# Patient Record
Sex: Female | Born: 1995 | Race: Black or African American | Hispanic: No | Marital: Single | Smoking: Never smoker
Health system: Southern US, Community
[De-identification: ages and names within clinical notes are randomized; demographics above are authoritative.]

## PROBLEM LIST (undated history)

## (undated) DIAGNOSIS — F419 Anxiety disorder, unspecified: Secondary | ICD-10-CM

## (undated) DIAGNOSIS — T7840XA Allergy, unspecified, initial encounter: Secondary | ICD-10-CM

## (undated) DIAGNOSIS — F32A Depression, unspecified: Secondary | ICD-10-CM

## (undated) DIAGNOSIS — J45909 Unspecified asthma, uncomplicated: Secondary | ICD-10-CM

## (undated) DIAGNOSIS — D649 Anemia, unspecified: Secondary | ICD-10-CM

## (undated) DIAGNOSIS — E119 Type 2 diabetes mellitus without complications: Secondary | ICD-10-CM

## (undated) HISTORY — DX: Unspecified asthma, uncomplicated: J45.909

## (undated) HISTORY — DX: Allergy, unspecified, initial encounter: T78.40XA

## (undated) HISTORY — DX: Depression, unspecified: F32.A

## (undated) HISTORY — DX: Anemia, unspecified: D64.9

## (undated) HISTORY — DX: Anxiety disorder, unspecified: F41.9

## (undated) HISTORY — DX: Type 2 diabetes mellitus without complications: E11.9

## (undated) HISTORY — PX: TONSILLECTOMY: SUR1361

---
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
## 2019-06-17 LAB — BASIC METABOLIC PANEL
BUN: 14 (ref 4–21)
Creatinine: 1.1 (ref 0.5–1.1)
Glucose: 382
Potassium: 4.3 (ref 3.4–5.3)
Sodium: 134 — AB (ref 137–147)

## 2019-06-17 LAB — HEPATIC FUNCTION PANEL
ALT: 13 (ref 7–35)
AST: 15 (ref 13–35)
Alkaline Phosphatase: 134 — AB (ref 25–125)
Bilirubin, Total: 0.7

## 2019-06-17 LAB — CBC AND DIFFERENTIAL
HCT: 41 (ref 36–46)
Hemoglobin: 13.3 (ref 12.0–16.0)
Neutrophils Absolute: 5
Platelets: 252 (ref 150–399)
WBC: 8.5

## 2019-06-17 LAB — NOVEL CORONAVIRUS, NAA: SARS-CoV-2, NAA: NOT DETECTED

## 2019-06-17 LAB — TSH: TSH: 0.69 (ref 0.41–5.90)

## 2019-06-18 LAB — CBC AND DIFFERENTIAL
HCT: 39 (ref 36–46)
Hemoglobin: 13 (ref 12.0–16.0)
Neutrophils Absolute: 7
Platelets: 220 (ref 150–399)
WBC: 11.4

## 2019-06-18 LAB — PROTIME-INR: Protime: 12 (ref 10.0–13.8)

## 2019-06-18 LAB — BASIC METABOLIC PANEL: BUN: 15 (ref 4–21)

## 2019-06-18 LAB — POCT INR: INR: 1.1 (ref 0.9–1.1)

## 2019-06-19 LAB — BASIC METABOLIC PANEL
Creatinine: 1 (ref 0.5–1.1)
Glucose: 483
Potassium: 4.3 (ref 3.4–5.3)
Sodium: 134 — AB (ref 137–147)

## 2019-07-03 ENCOUNTER — Other Ambulatory Visit: Payer: Self-pay

## 2019-07-04 ENCOUNTER — Other Ambulatory Visit: Payer: Commercial Managed Care - PPO

## 2019-07-04 ENCOUNTER — Encounter: Payer: Self-pay | Admitting: Nurse Practitioner

## 2019-07-04 ENCOUNTER — Ambulatory Visit: Payer: Commercial Managed Care - PPO | Admitting: Nurse Practitioner

## 2019-07-04 VITALS — BP 104/74 | HR 78 | Temp 97.5°F | Ht 68.0 in | Wt 219.2 lb

## 2019-07-04 DIAGNOSIS — E119 Type 2 diabetes mellitus without complications: Secondary | ICD-10-CM | POA: Insufficient documentation

## 2019-07-04 DIAGNOSIS — J4531 Mild persistent asthma with (acute) exacerbation: Secondary | ICD-10-CM | POA: Diagnosis not present

## 2019-07-04 DIAGNOSIS — E1165 Type 2 diabetes mellitus with hyperglycemia: Secondary | ICD-10-CM | POA: Diagnosis not present

## 2019-07-04 DIAGNOSIS — D573 Sickle-cell trait: Secondary | ICD-10-CM | POA: Insufficient documentation

## 2019-07-04 DIAGNOSIS — E1065 Type 1 diabetes mellitus with hyperglycemia: Secondary | ICD-10-CM | POA: Insufficient documentation

## 2019-07-04 LAB — MICROALBUMIN / CREATININE URINE RATIO
Creatinine,U: 94.3 mg/dL
Microalb Creat Ratio: 0.7 mg/g (ref 0.0–30.0)
Microalb, Ur: <0.7 mg/dL (ref 0.0–1.9)

## 2019-07-04 MED ORDER — FLUTICASONE-SALMETEROL 100-50 MCG/DOSE IN AEPB: 1.0000 | INHALATION_SPRAY | Freq: Two times a day (BID) | RESPIRATORY_TRACT | 3 refills | Status: DC

## 2019-07-04 MED ORDER — BLOOD GLUCOSE MONITOR KIT: PACK | 0 refills | Status: DC

## 2019-07-04 NOTE — Progress Notes (Signed)
Subjective:  Patient ID: Carrie Hudson, female    DOB: [birth year]  Age: 23 y.o. 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 She was evaluated in Ed in [place] for persistent SOB despite use of albuterol. She was found to have glucose level of 368. Reviewed lab and radiology results on patient's mobile device: CBC, CMP, TSH, pregnancy test, D-dimer, PT/INR, and COVID. Only acute finding was elevated glucose.  Asthma She complains of chest tightness, difficulty breathing and shortness of breath. There is no cough, frequent throat clearing, hemoptysis, hoarse voice, sputum production or wheezing. This is a recurrent problem. The current episode started more than 1 month ago. The problem occurs constantly. The problem has been unchanged. Associated symptoms include dyspnea on exertion and malaise/fatigue. Pertinent negatives include no appetite change, chest pain, ear congestion, ear pain, fever, headaches, heartburn, myalgias, nasal congestion, orthopnea, postnasal drip, rhinorrhea, sneezing, sore throat, sweats, trouble swallowing or weight loss. Her symptoms are aggravated by change in weather and strenuous activity. Her symptoms are alleviated by beta-agonist. She reports minimal improvement on treatment. Her past medical history is significant for asthma.  Diabetes She presents for her initial diabetic visit. No MedicAlert identification noted. Onset time: unknown. Pertinent negatives for hypoglycemia include no headaches or sweats. Associated symptoms include fatigue, polydipsia and polyuria. Pertinent negatives for diabetes include no blurred vision, no chest pain, no foot paresthesias, no foot ulcerations, no polyphagia, no visual change, no weakness and no weight loss. There are no hypoglycemic complications. There are no diabetic  complications. Risk factors for coronary artery disease include obesity. Current diabetic treatment includes oral agent (monotherapy). She is compliant with treatment all of the time. Her weight is stable. She is following a generally healthy diet. When asked about meal planning, she reported none. She has not had a previous visit with a dietitian. She participates in exercise daily. Home blood sugar record trend: does not check. An ACE inhibitor/angiotensin II receptor blocker is not being taken. She does not see a podiatrist.Eye exam is not current.   Past Medical History:  Diagnosis Date  . Allergy   . Asthma   . Diabetes mellitus without complication [facility])    Social History   Socioeconomic History  . Marital status: Single    Spouse name: Not on file  . Number of children: Not on file  . Years of education: Not on file  . Highest education level: Not on file  Occupational History  . Not on file  Social Needs  . Financial resource strain: Not on file  . Food insecurity    Worry: Not on file    Inability: Not on file  . Transportation needs    Medical: Not on file    Non-medical: Not on file  Tobacco Use  . Smoking status: Never Smoker  . Smokeless tobacco: Never Used  Substance and Sexual Activity  . Alcohol use: Yes    Alcohol/week: 1.0 standard drinks    Types: 1 Shots of liquor per week    Comment: once every 2 wks  . Drug use: Never  . Sexual activity: Not on file  Lifestyle  . Physical activity    Days per week: Not on file    Minutes per session: Not on file  . Stress: Not on file  Relationships  .  Social Herbalist on phone: Not on file    Gets together: Not on file    Attends religious service: Not on file    Active member of club or organization: Not on file    Attends meetings of clubs or organizations: Not on file    Relationship status: Not on file  . Intimate partner violence    Fear of current or ex partner: Not on file    Emotionally  abused: Not on file    Physically abused: Not on file    Forced sexual activity: Not on file  Other Topics Concern  . Not on file  Social History Narrative  . Not on file   Family History  Problem Relation Age of Onset  . Diabetes Father   . Diabetes Paternal Grandfather   . Sickle cell anemia Brother    Outpatient Medications Prior to Visit  Medication Sig Dispense Refill  . metFORMIN (GLUCOPHAGE) 1000 MG tablet     . montelukast (SINGULAIR) 10 MG tablet      No facility-administered medications prior to visit.     ROS See HPI  Objective:  BP 104/74   Pulse 78   Temp (!) 97.5 F (36.4 C) (Tympanic)   Ht '5\' 8"'  (1.727 m)   Wt 219 lb 3.2 oz (99.4 kg)   SpO2 97%   BMI 33.33 kg/m   BP Readings from Last 3 Encounters:  07/04/19 104/74    Wt Readings from Last 3 Encounters:  07/04/19 219 lb 3.2 oz (99.4 kg)    Physical Exam Vitals signs reviewed.  Constitutional:      Appearance: Normal appearance. She is obese.  Neck:     Musculoskeletal: Normal range of motion and neck supple.  Cardiovascular:     Rate and Rhythm: Normal rate and regular rhythm.     Pulses: Normal pulses.          Dorsalis pedis pulses are 2+ on the right side and 2+ on the left side.       Posterior tibial pulses are 2+ on the right side and 2+ on the left side.     Heart sounds: Normal heart sounds.  Pulmonary:     Effort: Pulmonary effort is normal.     Breath sounds: Normal breath sounds.  Musculoskeletal:     Right lower leg: No edema.     Left lower leg: No edema.     Right foot: Normal range of motion.     Left foot: Normal range of motion.  Feet:     Right foot:     Protective Sensation: 8 sites tested. 8 sites sensed.     Skin integrity: Skin integrity normal.     Toenail Condition: Right toenails are normal.     Left foot:     Protective Sensation: 8 sites tested. 8 sites sensed.     Skin integrity: Skin integrity normal.     Toenail Condition: Left toenails are normal.   Lymphadenopathy:     Cervical: No cervical adenopathy.  Skin:    General: Skin is warm and dry.  Neurological:     Mental Status: She is alert and oriented to person, place, and time.  Psychiatric:        Mood and Affect: Mood normal.        Behavior: Behavior normal.    Lab Results  Component Value Date   HGBA1C 11.5 (H) 07/04/2019   MICROALBUR <0.7 07/04/2019   Assessment &  Plan:   Maliyah was seen today for establish care.  Diagnoses and all orders for this visit:  Type 2 diabetes mellitus with hyperglycemia, without long-term current use of insulin (HCC) -     Hemoglobin A1c -     Microalbumin / creatinine urine ratio -     blood glucose meter kit and supplies KIT; Dispense based on patient and insurance preference. Check glucose before breakfast. E11.29  Mild persistent asthma with acute exacerbation -     Fluticasone-Salmeterol (ADVAIR) 100-50 MCG/DOSE AEPB; Inhale 1 puff into the lungs 2 (two) times daily. Rinse mouth after each use   I am having Taresa A. Esker start on Fluticasone-Salmeterol and blood glucose meter kit and supplies. I am also having her maintain her montelukast and metFORMIN.  Meds ordered this encounter  Medications  . Fluticasone-Salmeterol (ADVAIR) 100-50 MCG/DOSE AEPB    Sig: Inhale 1 puff into the lungs 2 (two) times daily. Rinse mouth after each use    Dispense:  60 each    Refill:  3    Order Specific Question:   Supervising Provider    Answer:   MATTHEWS, CODY [[id number]]  . blood glucose meter kit and supplies KIT    Sig: Dispense based on patient and insurance preference. Check glucose before breakfast. E11.29    Dispense:  1 each    Refill:  0    Order Specific Question:   Supervising Provider    Answer:   MATTHEWS, CODY [[id number]]    Order Specific Question:   Number of strips    Answer:   100    Order Specific Question:   Number of lancets    Answer:   100    Problem List Items Addressed This Visit      Respiratory   Mild persistent  asthma with acute exacerbation    Albuterol prn Added advair BID. F/up in 2weeks      Relevant Medications   montelukast (SINGULAIR) 10 MG tablet   Fluticasone-Salmeterol (ADVAIR) 100-50 MCG/DOSE AEPB     Endocrine   DM (diabetes mellitus) ([place]) - Primary    Newly diagnosed with glucose in 300s. Started on metformin. FHx of DM (father and PGF), no hx of juvenile DM  HgbA1c 11.5 Added glipizide and gave glucometer prescription. F/up 2weeks to review glucose checks. I advised her to contact a nutritionist in [place]      Relevant Medications   metFORMIN (GLUCOPHAGE) 1000 MG tablet   blood glucose meter kit and supplies KIT   Other Relevant Orders   Hemoglobin A1c   Microalbumin / creatinine urine ratio (Completed)       Follow-up: Return in about 2 weeks (around 07/18/2019) for DM and asthma (video).  Wilfred Lacy, NP

## 2019-07-04 NOTE — Patient Instructions (Addendum)
Sign medical release to get records from [place].  Go to lab for blood draw and urine collection  Check glucose once a day before breakfast and record. Start low carb diet and avoid high sugar foods/drinks Maintain adequate oral hydration with water. Continue metformin as prescribed.  F/up in 2weeks (video appt).  Diabetes Mellitus and Nutrition, Adult When you have diabetes (diabetes mellitus), it is very important to have healthy eating habits because your blood sugar (glucose) levels are greatly affected by what you eat and drink. Eating healthy foods in the appropriate amounts, at about the same times every day, can help you:  Control your blood glucose.  Lower your risk of heart disease.  Improve your blood pressure.  Reach or maintain a healthy weight. Every person with diabetes is different, and each person has different needs for a meal plan. Your health care provider may recommend that you work with a diet and nutrition specialist (dietitian) to make a meal plan that is best for you. Your meal plan may vary depending on factors such as:  The calories you need.  The medicines you take.  Your weight.  Your blood glucose, blood pressure, and cholesterol levels.  Your activity level.  Other health conditions you have, such as heart or kidney disease. How do carbohydrates affect me? Carbohydrates, also called carbs, affect your blood glucose level more than any other type of food. Eating carbs naturally raises the amount of glucose in your blood. Carb counting is a method for keeping track of how many carbs you eat. Counting carbs is important to keep your blood glucose at a healthy level, especially if you use insulin or take certain oral diabetes medicines. It is important to know how many carbs you can safely have in each meal. This is different for every person. Your dietitian can help you calculate how many carbs you should have at each meal and  for each snack. Foods that contain carbs include:  Bread, cereal, rice, pasta, and crackers.  Potatoes and corn.  Peas, beans, and lentils.  Milk and yogurt.  Fruit and juice.  Desserts, such as cakes, cookies, ice cream, and candy. How does alcohol affect me? Alcohol can cause a sudden decrease in blood glucose (hypoglycemia), especially if you use insulin or take certain oral diabetes medicines. Hypoglycemia can be a life-threatening condition. Symptoms of hypoglycemia (sleepiness, dizziness, and confusion) are similar to symptoms of having too much alcohol. If your health care provider says that alcohol is safe for you, follow these guidelines:  Limit alcohol intake to no more than 1 drink per day for nonpregnant women and 2 drinks per day for men. One drink equals 12 oz of beer, 5 oz of wine, or 1 oz of hard liquor.  Do not drink on an empty stomach.  Keep yourself hydrated with water, diet soda, or unsweetened iced tea.  Keep in mind that regular soda, juice, and other mixers may contain a lot of sugar and must be counted as carbs. What are tips for following this plan?  Reading food labels  Start by checking the serving size on the "Nutrition Facts" label of packaged foods and drinks. The amount of calories, carbs, fats, and other nutrients listed on the label is based on one serving of the item. Many items contain more than one serving per package.  Check the total grams (g) of carbs in one serving. You can calculate the number of servings of carbs in one serving  by dividing the total carbs by 15. For example, if a food has 30 g of total carbs, it would be equal to 2 servings of carbs.  Check the number of grams (g) of saturated and trans fats in one serving. Choose foods that have low or no amount of these fats.  Check the number of milligrams (mg) of salt (sodium) in one serving. Most people should limit total sodium intake to less than 2,300 mg per day.  Always check  the nutrition information of foods labeled as "low-fat" or "nonfat". These foods may be higher in added sugar or refined carbs and should be avoided.  Talk to your dietitian to identify your daily goals for nutrients listed on the label. Shopping  Avoid buying canned, premade, or processed foods. These foods tend to be high in fat, sodium, and added sugar.  Shop around the outside edge of the grocery store. This includes fresh fruits and vegetables, bulk grains, fresh meats, and fresh dairy. Cooking  Use low-heat cooking methods, such as baking, instead of high-heat cooking methods like deep frying.  Cook using healthy oils, such as olive, canola, or sunflower oil.  Avoid cooking with butter, cream, or high-fat meats. Meal planning  Eat meals and snacks regularly, preferably at the same times every day. Avoid going long periods of time without eating.  Eat foods high in fiber, such as fresh fruits, vegetables, beans, and whole grains. Talk to your dietitian about how many servings of carbs you can eat at each meal.  Eat 4-6 ounces (oz) of lean protein each day, such as lean meat, chicken, fish, eggs, or tofu. One oz of lean protein is equal to: ? 1 oz of meat, chicken, or fish. ? 1 egg. ?  cup of tofu.  Eat some foods each day that contain healthy fats, such as avocado, nuts, seeds, and fish. Lifestyle  Check your blood glucose regularly.  Exercise regularly as told by your health care provider. This may include: ? 150 minutes of moderate-intensity or vigorous-intensity exercise each week. This could be brisk walking, biking, or water aerobics. ? Stretching and doing strength exercises, such as yoga or weightlifting, at least 2 times a week.  Take medicines as told by your health care provider.  Do not use any products that contain nicotine or tobacco, such as cigarettes and e-cigarettes. If you need help quitting, ask your health care provider.  Work with a Veterinary surgeon or  diabetes educator to identify strategies to manage stress and any emotional and social challenges. Questions to ask a health care provider  Do I need to meet with a diabetes educator?  Do I need to meet with a dietitian?  What number can I call if I have questions?  When are the best times to check my blood glucose? Where to find more information:  American Diabetes Association: diabetes.org  Academy of Nutrition and Dietetics: www.eatright.[employer] of Diabetes and Digestive and Kidney Diseases (NIH): CarFlippers.tn Summary  A healthy meal plan will help you control your blood glucose and maintain a healthy lifestyle.  Working with a diet and nutrition specialist (dietitian) can help you make a meal plan that is best for you.  Keep in mind that carbohydrates (carbs) and alcohol have immediate effects on your blood glucose levels. It is important to count carbs and to use alcohol carefully. This information is not intended to replace advice given to you by your health care provider. Make sure you discuss any questions you  have with your health care provider. Document Released: 06/15/2005 Document Revised: 08/31/2017 Document Reviewed: 10/23/2016 Elsevier Patient Education  2020 [employer].

## 2019-07-05 LAB — HEMOGLOBIN A1C
Hgb A1c MFr Bld: 11.5 % of total Hgb — ABNORMAL HIGH (ref <5.7)
Mean Plasma Glucose: 283 (calc)
eAG (mmol/L): 15.7 (calc)

## 2019-07-07 MED ORDER — GLIPIZIDE ER 5 MG PO TB24: 5.0000 mg | ORAL_TABLET | Freq: Every day | ORAL | 1 refills | Status: DC

## 2019-07-07 NOTE — Assessment & Plan Note (Signed)
Albuterol prn Added advair BID. F/up in 2weeks

## 2019-07-07 NOTE — Assessment & Plan Note (Signed)
Newly diagnosed with glucose in 300s. Started on metformin. FHx of DM (father and PGF), no hx of juvenile DM  HgbA1c 11.5 Added glipizide and gave glucometer prescription. F/up 2weeks to review glucose checks. I advised her to contact a nutritionist in [place]

## 2019-07-09 ENCOUNTER — Encounter: Payer: Self-pay | Admitting: Nurse Practitioner

## 2019-07-09 LAB — POCT RAPID INFLUENZA A&B
Influenza A: NEGATIVE
Influenza B: NEGATIVE

## 2019-07-09 LAB — D-DIMER, QUANTITATIVE: D-Dimer, Quant: 1.57

## 2019-07-09 LAB — SARS-COV-2 IGG: SARS-CoV-2 Ab, IgG: NONREACTIVE

## 2019-07-09 LAB — TROPONIN I (HIGH SENSITIVITY): Troponin I (High Sensitivity): 3

## 2019-07-09 LAB — POCT RAPID STREP A: Strep A Ag: NEGATIVE

## 2019-07-09 NOTE — Progress Notes (Signed)
Abstracted sent to be scanned. 

## 2019-07-09 NOTE — Progress Notes (Unsigned)
Abstracted result and sent to scan  

## 2019-07-17 ENCOUNTER — Other Ambulatory Visit: Payer: Self-pay

## 2019-07-17 ENCOUNTER — Encounter: Payer: Self-pay | Admitting: Nurse Practitioner

## 2019-07-17 ENCOUNTER — Telehealth: Payer: Self-pay

## 2019-07-17 ENCOUNTER — Telehealth (INDEPENDENT_AMBULATORY_CARE_PROVIDER_SITE_OTHER): Payer: Commercial Managed Care - PPO | Admitting: Nurse Practitioner

## 2019-07-17 DIAGNOSIS — E1165 Type 2 diabetes mellitus with hyperglycemia: Secondary | ICD-10-CM | POA: Diagnosis not present

## 2019-07-17 DIAGNOSIS — J4531 Mild persistent asthma with (acute) exacerbation: Secondary | ICD-10-CM | POA: Diagnosis not present

## 2019-07-17 MED ORDER — GLIPIZIDE ER 2.5 MG PO TB24: 2.5000 mg | ORAL_TABLET | Freq: Every day | ORAL | 1 refills | Status: DC

## 2019-07-17 MED ORDER — ALBUTEROL SULFATE HFA 108 (90 BASE) MCG/ACT IN AERS: 1.0000 | INHALATION_SPRAY | Freq: Four times a day (QID) | RESPIRATORY_TRACT | 0 refills | Status: DC | PRN

## 2019-07-17 MED ORDER — FLUTICASONE-SALMETEROL 250-50 MCG/DOSE IN AEPB: 1.0000 | INHALATION_SPRAY | Freq: Two times a day (BID) | RESPIRATORY_TRACT | 1 refills | Status: DC

## 2019-07-17 MED ORDER — METFORMIN HCL 850 MG PO TABS: 850.0000 mg | ORAL_TABLET | Freq: Two times a day (BID) | ORAL | 5 refills | Status: DC

## 2019-07-17 NOTE — Telephone Encounter (Signed)
Form faxed to Internal Medicine at [phone]. Used the Med Rec request for continuity of care form (hopfully pt does have to sign a release form which required sig).

## 2019-07-17 NOTE — Patient Instructions (Addendum)
Have PFT results faxed to me: [phone]. Change advair dose to 2puffs BID (rinse mouth after each use) till you complete current canister. With new anister, start 1puff BID.  Check glucose in AM before breakfast. Start glipizide continue metformin.

## 2019-07-17 NOTE — Telephone Encounter (Signed)
Copied from [place] [id number]. Topic: General - Inquiry >> Jul 17, 2019  2:20 PM Virl Axe D wrote: Reason for CRM: Pt stated that she had a pulmonary function test done at [facility] in [place], [place]. They require someone in office to request lab results to be sent to office. They would not send them at pt's request. Please advise  [phone]

## 2019-07-17 NOTE — Progress Notes (Signed)
Virtual Visit via Video Note  I connected with Carrie Hudson on 07/17/19 at  1:30 PM EDT by a video enabled telemedicine application and verified that I am speaking with the correct person using two identifiers.  Location: Patient: Home Provider: Office   I discussed the limitations of evaluation and management by telemedicine and the availability of in person appointments. The patient expressed understanding and agreed to proceed.  CC: DM and Asthma f/up  History of Present Illness: DM: She is unable to afford glucometer kit, so she has been using her coaches' Glucose check post prandial 2times a week: 150-200s Did not start glipizide as prescribed Current use of metformin BID, has nausea if taken without food.  Asthma: Mild improvement in SOB and chest tightness. Worse with activity, worse in AM, use of albuterol at least once a day. No cough, no nasal congestion, no heartburn, no LE edema States she had PFT completed last week.   Observations/Objective: Unable to provide any vital signs. Physical Exam  Constitutional: She is oriented to person, place, and time. No distress.  Pulmonary/Chest: Effort normal.  Neurological: She is alert and oriented to person, place, and time.  Psychiatric: She has a normal mood and affect. Her behavior is normal. Thought content normal.   Assessment and Plan: Mykaylah was seen today for follow-up.  Diagnoses and all orders for this visit:  Type 2 diabetes mellitus with hyperglycemia, without long-term current use of insulin (HCC) -     glipiZIDE (GLUCOTROL XL) 2.5 MG 24 hr tablet; Take 1 tablet (2.5 mg total) by mouth daily with breakfast. -     metFORMIN (GLUCOPHAGE) 850 MG tablet; Take 1 tablet (850 mg total) by mouth 2 (two) times daily with a meal.  Mild persistent asthma with acute exacerbation -     albuterol (VENTOLIN HFA) 108 (90 Base) MCG/ACT inhaler; Inhale 1 puff into the lungs every 6 (six) hours as needed for shortness of breath. -      Fluticasone-Salmeterol (ADVAIR DISKUS) 250-50 MCG/DOSE AEPB; Inhale 1 puff into the lungs 2 (two) times daily.   Follow Up Instructions: See avs   I discussed the assessment and treatment plan with the patient. The patient was provided an opportunity to ask questions and all were answered. The patient agreed with the plan and demonstrated an understanding of the instructions.   The patient was advised to call back or seek an in-person evaluation if the symptoms worsen or if the condition fails to improve as anticipated.  Wilfred Lacy, NP

## 2019-07-23 NOTE — Telephone Encounter (Signed)
Refax the request today.

## 2019-07-25 NOTE — Telephone Encounter (Signed)
Place on [place] desk to review.

## 2019-08-12 ENCOUNTER — Telehealth: Payer: Self-pay | Admitting: Nurse Practitioner

## 2019-08-12 DIAGNOSIS — J4531 Mild persistent asthma with (acute) exacerbation: Secondary | ICD-10-CM

## 2019-08-12 NOTE — Telephone Encounter (Signed)
LVM for the pt to call back, need to know if she requesting from [facility].

## 2019-08-12 NOTE — Telephone Encounter (Signed)
Pt returned called, pt says that she doesn't need Rx at the moment because she just picked up a new one. She will let PCP know when a refill is needed.

## 2019-08-26 ENCOUNTER — Ambulatory Visit: Payer: Self-pay | Admitting: Nurse Practitioner

## 2019-10-14 ENCOUNTER — Encounter: Payer: Self-pay | Admitting: Nurse Practitioner

## 2019-10-14 ENCOUNTER — Other Ambulatory Visit: Payer: Self-pay

## 2019-10-14 ENCOUNTER — Ambulatory Visit (INDEPENDENT_AMBULATORY_CARE_PROVIDER_SITE_OTHER): Payer: 59 | Admitting: Nurse Practitioner

## 2019-10-14 VITALS — BP 120/72 | HR 87 | Temp 97.1°F | Ht 68.0 in | Wt 181.2 lb

## 2019-10-14 DIAGNOSIS — E1065 Type 1 diabetes mellitus with hyperglycemia: Secondary | ICD-10-CM | POA: Diagnosis not present

## 2019-10-14 LAB — BASIC METABOLIC PANEL
BUN: 9 mg/dL (ref 6–23)
CO2: 19 mEq/L (ref 19–32)
Calcium: 9.5 mg/dL (ref 8.4–10.5)
Chloride: 100 mEq/L (ref 96–112)
Creatinine, Ser: 1.09 mg/dL (ref 0.40–1.20)
GFR: 75.17 mL/min (ref >60.00)
Glucose, Bld: 316 mg/dL — ABNORMAL HIGH (ref 70–99)
Potassium: 4.2 mEq/L (ref 3.5–5.1)
Sodium: 134 mEq/L — ABNORMAL LOW (ref 135–145)

## 2019-10-14 LAB — MICROALBUMIN / CREATININE URINE RATIO
Creatinine,U: 44.7 mg/dL
Microalb Creat Ratio: 1.6 mg/g (ref 0.0–30.0)
Microalb, Ur: <0.7 mg/dL (ref 0.0–1.9)

## 2019-10-14 LAB — HEMOGLOBIN A1C: Hgb A1c MFr Bld: 14.2 % — ABNORMAL HIGH (ref 4.6–6.5)

## 2019-10-14 MED ORDER — BASAGLAR KWIKPEN 100 UNIT/ML ~~LOC~~ SOPN: 10.0000 [IU] | PEN_INJECTOR | Freq: Every day | SUBCUTANEOUS | 0 refills | Status: DC

## 2019-10-14 MED ORDER — BLOOD GLUCOSE MONITOR KIT: PACK | 0 refills | Status: AC

## 2019-10-14 MED ORDER — NOVOLIN 70/30 FLEXPEN (70-30) 100 UNIT/ML ~~LOC~~ SUPN: 5.0000 [IU] | PEN_INJECTOR | Freq: Two times a day (BID) | SUBCUTANEOUS | 0 refills | Status: DC

## 2019-10-14 MED ORDER — PEN NEEDLES 31G X 6 MM MISC: 1.0000 "application " | Freq: Every day | 3 refills | Status: DC

## 2019-10-14 NOTE — Assessment & Plan Note (Addendum)
Worsening glucose control with hgbA1c of 14 Urine negative for protein I recommend use of basaglar at bedtime and meal insulin BID at this time. Medications sent. It is imperative for you to start checking glucose before breakfast and supper. Taking insulin without monitoring glucose, can lead to severe hypoglycemia. F/up in 2weeks as discussed Pending C-peptide, GAD antibody, and insulin

## 2019-10-14 NOTE — Patient Instructions (Addendum)
Normal urine microalbumin Worsening glucose control with hgbA1c of 14 I recommend use of basaglar at bedtime and meal insulin BID at this time. Medications sent. It is imperative for you to start checking glucose before breakfast and supper. Taking insulin without monitoring glucose, can lead to severe hypoglycemia. F/up in 2weeks as discussed  Diabetes Basics  Diabetes (diabetes mellitus) is a long-term (chronic) disease. It occurs when the body does not properly use sugar (glucose) that is released from food after you eat. Diabetes may be caused by one or both of these problems:  Your pancreas does not make enough of a hormone called insulin.  Your body does not react in a normal way to insulin that it makes. Insulin lets sugars (glucose) go into cells in your body. This gives you energy. If you have diabetes, sugars cannot get into cells. This causes high blood sugar (hyperglycemia). Follow these instructions at home: How is diabetes treated? You may need to take insulin or other diabetes medicines daily to keep your blood sugar in balance. Take your diabetes medicines every day as told by your doctor. List your diabetes medicines here: Diabetes medicines  Name of medicine: ______________________________ ? Amount (dose): _______________ Time (a.m./p.m.): _______________ Notes: ___________________________________  Name of medicine: ______________________________ ? Amount (dose): _______________ Time (a.m./p.m.): _______________ Notes: ___________________________________  Name of medicine: ______________________________ ? Amount (dose): _______________ Time (a.m./p.m.): _______________ Notes: ___________________________________ If you use insulin, you will learn how to give yourself insulin by injection. You may need to adjust the amount based on the food that you eat. List the types of insulin you use here: Insulin  Insulin type: ______________________________ ? Amount (dose):  _______________ Time (a.m./p.m.): _______________ Notes: ___________________________________  Insulin type: ______________________________ ? Amount (dose): _______________ Time (a.m./p.m.): _______________ Notes: ___________________________________  Insulin type: ______________________________ ? Amount (dose): _______________ Time (a.m./p.m.): _______________ Notes: ___________________________________  Insulin type: ______________________________ ? Amount (dose): _______________ Time (a.m./p.m.): _______________ Notes: ___________________________________  Insulin type: ______________________________ ? Amount (dose): _______________ Time (a.m./p.m.): _______________ Notes: ___________________________________ How do I manage my blood sugar?  Check your blood sugar levels using a blood glucose monitor as directed by your doctor. Your doctor will set treatment goals for you. Generally, you should have these blood sugar levels:  Before meals (preprandial): 80-130 mg/dL (4.4-7.2 mmol/L).  After meals (postprandial): below 180 mg/dL (10 mmol/L).  A1c level: less than 7%. Write down the times that you will check your blood sugar levels: Blood sugar checks  Time: _______________ Notes: ___________________________________  Time: _______________ Notes: ___________________________________  Time: _______________ Notes: ___________________________________  Time: _______________ Notes: ___________________________________  Time: _______________ Notes: ___________________________________  Time: _______________ Notes: ___________________________________  What do I need to know about low blood sugar? Low blood sugar is called hypoglycemia. This is when blood sugar is at or below 70 mg/dL (3.9 mmol/L). Symptoms may include:  Feeling: ? Hungry. ? Worried or nervous (anxious). ? Sweaty and clammy. ? Confused. ? Dizzy. ? Sleepy. ? Sick to your stomach (nauseous).  Having: ? A fast  heartbeat. ? A headache. ? A change in your vision. ? Tingling or no feeling (numbness) around the mouth, lips, or tongue. ? Jerky movements that you cannot control (seizure).  Having trouble with: ? Moving (coordination). ? Sleeping. ? Passing out (fainting). ? Getting upset easily (irritability). Treating low blood sugar To treat low blood sugar, eat or drink something sugary right away. If you can think clearly and swallow safely, follow the 15:15 rule:  Take 15 grams of a fast-acting carb (carbohydrate). Talk with  your doctor about how much you should take.  Some fast-acting carbs are: ? Sugar tablets (glucose pills). Take 3-4 glucose pills. ? 6-8 pieces of hard candy. ? 4-6 oz (120-150 mL) of fruit juice. ? 4-6 oz (120-150 mL) of regular (not diet) soda. ? 1 Tbsp (15 mL) honey or sugar.  Check your blood sugar 15 minutes after you take the carb.  If your blood sugar is still at or below 70 mg/dL (3.9 mmol/L), take 15 grams of a carb again.  If your blood sugar does not go above 70 mg/dL (3.9 mmol/L) after 3 tries, get help right away.  After your blood sugar goes back to normal, eat a meal or a snack within 1 hour. Treating very low blood sugar If your blood sugar is at or below 54 mg/dL (3 mmol/L), you have very low blood sugar (severe hypoglycemia). This is an emergency. Do not wait to see if the symptoms will go away. Get medical help right away. Call your local emergency services (911 in the [place] Do not drive yourself to the hospital. Questions to ask your health care provider  Do I need to meet with a diabetes educator?  What equipment will I need to care for myself at home?  What diabetes medicines do I need? When should I take them?  How often do I need to check my blood sugar?  What number can I call if I have questions?  When is my next doctor's visit?  Where can I find a support group for people with diabetes? Where to find more information  American  Diabetes Association: www.diabetes.org  American Association of Diabetes Educators: www.diabeteseducator.org/patient-resources Contact a doctor if:  Your blood sugar is at or above 240 mg/dL (94.8 mmol/L) for 2 days in a row.  You have been sick or have had a fever for 2 days or more, and you are not getting better.  You have any of these problems for more than 6 hours: ? You cannot eat or drink. ? You feel sick to your stomach (nauseous). ? You throw up (vomit). ? You have watery poop (diarrhea). Get help right away if:  Your blood sugar is lower than 54 mg/dL (3 mmol/L).  You get confused.  You have trouble: ? Thinking clearly. ? Breathing. Summary  Diabetes (diabetes mellitus) is a long-term (chronic) disease. It occurs when the body does not properly use sugar (glucose) that is released from food after digestion.  Take insulin and diabetes medicines as told.  Check your blood sugar every day, as often as told.  Keep all follow-up visits as told by your doctor. This is important. This information is not intended to replace advice given to you by your health care provider. Make sure you discuss any questions you have with your health care provider. Document Revised: 06/11/2019 Document Reviewed: 12/21/2017 Elsevier Patient Education  2020 [employer].

## 2019-10-14 NOTE — Progress Notes (Addendum)
Subjective:  Patient ID: Carrie Hudson, female    DOB: Apr 14, 1996  Age: 24 y.o. MRN: [id number]  CC: Follow-up (follow up on DM/--stop taking metformin and glipizide due to nausea and not want to eat,not feel good when take thi smed/ pls resend glucose kit/ FYI--will schedule eye check,denie any shot today)  HPI  DM: Discontinued metformin and glipizide due to side effects (nausea, ABD pain, and fatigue). Does not monitor glucose at home. States she does not need referral to nutritionist at this time. 15lbs weight loss noted in last 53month (unintentional) Wt Readings from Last 3 Encounters:  11/12/19 182 lb 9.6 oz (82.8 kg)  10/14/19 181 lb 3.2 oz (82.2 kg)  07/17/19 215 lb (97.5 kg)   Reviewed past Medical, Social and Family history today.  Outpatient Medications Prior to Visit  Medication Sig Dispense Refill  . albuterol (VENTOLIN HFA) 108 (90 Base) MCG/ACT inhaler Inhale 1 puff into the lungs every 6 (six) hours as needed for shortness of breath. 6.7 g 0  . Fluticasone Propionate (FLONASE NA) Place into the nose.    .Marland KitchenFluticasone-Salmeterol (ADVAIR DISKUS) 250-50 MCG/DOSE AEPB Inhale 1 puff into the lungs 2 (two) times daily. 60 each 1  . montelukast (SINGULAIR) 10 MG tablet     . blood glucose meter kit and supplies KIT Dispense based on patient and insurance preference. Check glucose before breakfast. E11.29 1 each 0  . glipiZIDE (GLUCOTROL XL) 2.5 MG 24 hr tablet Take 1 tablet (2.5 mg total) by mouth daily with breakfast. (Patient not taking: Reported on 10/14/2019) 90 tablet 1  . metFORMIN (GLUCOPHAGE) 850 MG tablet Take 1 tablet (850 mg total) by mouth 2 (two) times daily with a meal. (Patient not taking: Reported on 10/14/2019) 60 tablet 5   No facility-administered medications prior to visit.    ROS See HPI  Objective:  BP 120/72   Pulse 87   Temp (!) 97.1 F (36.2 C) (Tympanic)   Ht 5' 8" (1.727 m)   Wt 181 lb 3.2 oz (82.2 kg)   SpO2 99%   BMI 27.55 kg/m   BP  Readings from Last 3 Encounters:  11/12/19 116/78  10/14/19 120/72  07/04/19 104/74    Wt Readings from Last 3 Encounters:  11/12/19 182 lb 9.6 oz (82.8 kg)  10/14/19 181 lb 3.2 oz (82.2 kg)  07/17/19 215 lb (97.5 kg)    Physical Exam Vitals reviewed.  Neck:     Thyroid: No thyroid mass, thyromegaly or thyroid tenderness.  Cardiovascular:     Rate and Rhythm: Normal rate and regular rhythm.     Pulses: Normal pulses.     Heart sounds: Normal heart sounds.  Pulmonary:     Effort: Pulmonary effort is normal.     Breath sounds: Normal breath sounds.  Musculoskeletal:        General: Normal range of motion.     Cervical back: Normal range of motion and neck supple.     Right lower leg: No edema.     Left lower leg: No edema.  Lymphadenopathy:     Cervical: No cervical adenopathy.  Skin:    General: Skin is dry.  Neurological:     Mental Status: She is alert and oriented to person, place, and time.  Psychiatric:        Mood and Affect: Mood normal.        Behavior: Behavior normal.        Thought Content: Thought content normal.  Lab Results  Component Value Date   WBC 11.4 06/18/2019   HGB 13.0 06/18/2019   HCT 39 06/18/2019   PLT 220 06/18/2019   GLUCOSE 316 (H) 10/14/2019   ALT 13 06/17/2019   AST 15 06/17/2019   NA 134 (L) 10/14/2019   K 4.2 10/14/2019   CL 100 10/14/2019   CREATININE 1.09 10/14/2019   BUN 9 10/14/2019   CO2 19 10/14/2019   TSH 0.69 06/17/2019   INR 1.1 06/18/2019   HGBA1C 14.2 (H) 10/14/2019   MICROALBUR <0.7 10/14/2019   Assessment & Plan:  This visit occurred during the SARS-CoV-2 public health emergency.  Safety protocols were in place, including screening questions prior to the visit, additional usage of staff PPE, and extensive cleaning of exam room while observing appropriate contact time as indicated for disinfecting solutions.   Carrie Hudson was seen today for follow-up.  Diagnoses and all orders for this visit:  Type 1 diabetes  mellitus with hyperglycemia (HCC) -     Hemoglobin A1c -     HTN_1 BMP -     blood glucose meter kit and supplies KIT; Dispense based on patient and insurance preference. Check glucose before breakfast. E11.29 -     Microalbumin / creatinine urine ratio -     GAD65, IA-2, and Insulin Autoantibody serum -     C-peptide -     Discontinue: Insulin Glargine (BASAGLAR KWIKPEN) 100 UNIT/ML SOPN; Inject 0.1 mLs (10 Units total) into the skin at bedtime. -     Discontinue: Insulin Isophane & Regular Human (NOVOLIN 70/30 FLEXPEN) (70-30) 100 UNIT/ML PEN; Inject 5 Units into the skin 2 (two) times daily with a meal. Do not take if glucose <150 (Patient not taking: Reported on 11/04/2019) -     Discontinue: Insulin Pen Needle (PEN NEEDLES) 31G X 6 MM MISC; 1 application by Does not apply route at bedtime. -     Ambulatory referral to Endocrinology -     Referral to Nutrition and Diabetes Services   I have discontinued Carrie Hudson's glipiZIDE and metFORMIN. I am also having her maintain her montelukast, albuterol, Fluticasone-Salmeterol, Fluticasone Propionate (FLONASE NA), and blood glucose meter kit and supplies.  Meds ordered this encounter  Medications  . blood glucose meter kit and supplies KIT    Sig: Dispense based on patient and insurance preference. Check glucose before breakfast. E11.29    Dispense:  1 each    Refill:  0    Order Specific Question:   Supervising Provider    Answer:   MATTHEWS, CODY [[id number]]    Order Specific Question:   Number of strips    Answer:   100    Order Specific Question:   Number of lancets    Answer:   100  . DISCONTD: Insulin Glargine (BASAGLAR KWIKPEN) 100 UNIT/ML SOPN    Sig: Inject 0.1 mLs (10 Units total) into the skin at bedtime.    Dispense:  5 pen    Refill:  0    Order Specific Question:   Supervising Provider    Answer:   MATTHEWS, CODY [[id number]]  . DISCONTD: Insulin Isophane & Regular Human (NOVOLIN 70/30 FLEXPEN) (70-30) 100 UNIT/ML PEN    Sig:  Inject 5 Units into the skin 2 (two) times daily with a meal. Do not take if glucose <150    Dispense:  15 mL    Refill:  0    Order Specific Question:   Supervising Provider    Answer:  MATTHEWS, CODY [[id number]]  . DISCONTD: Insulin Pen Needle (PEN NEEDLES) 31G X 6 MM MISC    Sig: 1 application by Does not apply route at bedtime.    Dispense:  100 each    Refill:  3    Order Specific Question:   Supervising Provider    Answer:   MATTHEWS, CODY [[id number]]    Problem List Items Addressed This Visit      Endocrine   Type 1 diabetes mellitus with hyperglycemia ([place]) - Primary    Worsening glucose control with hgbA1c of 14 Urine negative for protein I recommend use of basaglar at bedtime and meal insulin BID at this time. Medications sent. It is imperative for you to start checking glucose before breakfast and supper. Taking insulin without monitoring glucose, can lead to severe hypoglycemia. F/up in 2weeks as discussed Pending C-peptide, GAD antibody, and insulin       Relevant Medications   blood glucose meter kit and supplies KIT   Other Relevant Orders   Hemoglobin A1c (Completed)   HTN_1 BMP (Completed)   Microalbumin / creatinine urine ratio (Completed)   GAD65, IA-2, and Insulin Autoantibody serum (Completed)   C-peptide (Completed)   Ambulatory referral to Endocrinology   Referral to Nutrition and Diabetes Services      Follow-up: Return in about 2 weeks (around 10/28/2019) for DM (video, 72mn).  CWilfred Lacy NP

## 2019-10-18 ENCOUNTER — Encounter: Payer: Self-pay | Admitting: Nurse Practitioner

## 2019-10-18 DIAGNOSIS — Z794 Long term (current) use of insulin: Secondary | ICD-10-CM

## 2019-10-18 DIAGNOSIS — E1165 Type 2 diabetes mellitus with hyperglycemia: Secondary | ICD-10-CM

## 2019-10-22 LAB — C-PEPTIDE: C-Peptide: 0.54 ng/mL — ABNORMAL LOW (ref 0.80–3.85)

## 2019-10-22 LAB — GAD65, IA-2, AND INSULIN AUTOANTIBODY SERUM
Glutamic Acid Decarb Ab: 22 IU/mL — ABNORMAL HIGH (ref <5)
IA-2 Antibody: 5.8 U/mL — ABNORMAL HIGH (ref <5.4)
Insulin Antibodies, Human: <0.4 U/mL (ref <0.4)

## 2019-10-22 MED ORDER — INSULIN GLARGINE 100 UNITS/ML SOLOSTAR PEN: 10.0000 [IU] | PEN_INJECTOR | Freq: Every day | SUBCUTANEOUS | 0 refills | Status: DC

## 2019-10-22 NOTE — Addendum Note (Signed)
Addended by: Michaela Corner on: 10/22/2019 08:07 PM   Modules accepted: Orders

## 2019-10-27 ENCOUNTER — Telehealth: Payer: Self-pay | Admitting: Nurse Practitioner

## 2019-10-27 NOTE — Telephone Encounter (Signed)
Patient is calling back for lab results. CB is [phone].

## 2019-10-28 ENCOUNTER — Telehealth: Payer: 59 | Admitting: Nurse Practitioner

## 2019-11-04 ENCOUNTER — Other Ambulatory Visit: Payer: Self-pay

## 2019-11-04 ENCOUNTER — Encounter: Payer: Self-pay | Admitting: Nurse Practitioner

## 2019-11-04 ENCOUNTER — Telehealth (INDEPENDENT_AMBULATORY_CARE_PROVIDER_SITE_OTHER): Payer: 59 | Admitting: Nurse Practitioner

## 2019-11-04 DIAGNOSIS — E1065 Type 1 diabetes mellitus with hyperglycemia: Secondary | ICD-10-CM | POA: Diagnosis not present

## 2019-11-04 MED ORDER — "INSULIN SYRINGE 29G X 1/2"" 0.5 ML MISC": 1.0000 [IU] | Freq: Two times a day (BID) | 5 refills | Status: DC

## 2019-11-04 MED ORDER — NOVOLIN 70/30 (70-30) 100 UNIT/ML ~~LOC~~ SUSP: 5.0000 [IU] | Freq: Two times a day (BID) | SUBCUTANEOUS | 5 refills | Status: DC

## 2019-11-04 NOTE — Patient Instructions (Addendum)
Maintain appt with endocrinology and nutritionist. Check glucose before breakfast and before supper. Continue lantus and increase dose to 15units Start novolin 5uits BID with breakfast and supper.  Hypoglycemia Hypoglycemia is when the sugar (glucose) level in your blood is too low. Signs of low blood sugar may include:  Feeling: ? Hungry. ? Worried or nervous (anxious). ? Sweaty and clammy. ? Confused. ? Dizzy. ? Sleepy. ? Sick to your stomach (nauseous).  Having: ? A fast heartbeat. ? A headache. ? A change in your vision. ? Tingling or no feeling (numbness) around your mouth, lips, or tongue. ? Jerky movements that you cannot control (seizure).  Having trouble with: ? Moving (coordination). ? Sleeping. ? Passing out (fainting). ? Getting upset easily (irritability). Low blood sugar can happen to people who have diabetes and people who do not have diabetes. Low blood sugar can happen quickly, and it can be an emergency. Treating low blood sugar Low blood sugar is often treated by eating or drinking something sugary right away, such as:  Fruit juice, 4-6 oz (120-150 mL).  Regular soda (not diet soda), 4-6 oz (120-150 mL).  Low-fat milk, 4 oz (120 mL).  Several pieces of hard candy.  Sugar or honey, 1 Tbsp (15 mL). Treating low blood sugar if you have diabetes If you can think clearly and swallow safely, follow the 15:15 rule:  Take 15 grams of a fast-acting carb (carbohydrate). Talk with your doctor about how much you should take.  Always keep a source of fast-acting carb with you, such as: ? Sugar tablets (glucose pills). Take 3-4 pills. ? 6-8 pieces of hard candy. ? 4-6 oz (120-150 mL) of fruit juice. ? 4-6 oz (120-150 mL) of regular (not diet) soda. ? 1 Tbsp (15 mL) honey or sugar.  Check your blood sugar 15 minutes after you take the carb.  If your blood sugar is still at or below 70 mg/dL (3.9 mmol/L), take 15 grams of a carb again.  If your blood sugar  does not go above 70 mg/dL (3.9 mmol/L) after 3 tries, get help right away.  After your blood sugar goes back to normal, eat a meal or a snack within 1 hour.  Treating very low blood sugar If your blood sugar is at or below 54 mg/dL (3 mmol/L), you have very low blood sugar (severe hypoglycemia). This may also cause:  Passing out.  Jerky movements you cannot control (seizure).  Losing consciousness (coma). This is an emergency. Do not wait to see if the symptoms will go away. Get medical help right away. Call your local emergency services (911 in the [place] Do not drive yourself to the hospital. If you have very low blood sugar and you cannot eat or drink, you may need a glucagon shot (injection). A family member or friend should learn how to check your blood sugar and how to give you a glucagon shot. Ask your doctor if you need to have a glucagon shot kit at home. Follow these instructions at home: General instructions  Take over-the-counter and prescription medicines only as told by your doctor.  Stay aware of your blood sugar as told by your doctor.  Limit alcohol intake to no more than 1 drink a day for nonpregnant women and 2 drinks a day for men. One drink equals 12 oz of beer (355 mL), 5 oz of wine (148 mL), or 1 oz of hard liquor (44 mL).  Keep all follow-up visits as told by your doctor. This  is important. If you have diabetes:   Follow your diabetes care plan as told by your doctor. Make sure you: ? Know the signs of low blood sugar. ? Take your medicines as told. ? Follow your exercise and meal plan. ? Eat on time. Do not skip meals. ? Check your blood sugar as often as told by your doctor. Always check it before and after exercise. ? Follow your sick day plan when you cannot eat or drink normally. Make this plan ahead of time with your doctor.  Share your diabetes care plan with: ? Your work or school. ? People you live with.  Check your pee (urine) for  ketones: ? When you are sick. ? As told by your doctor.  Carry a card or wear jewelry that says you have diabetes. Contact a doctor if:  You have trouble keeping your blood sugar in your target range.  You have low blood sugar often. Get help right away if:  You still have symptoms after you eat or drink something sugary.  Your blood sugar is at or below 54 mg/dL (3 mmol/L).  You have jerky movements that you cannot control.  You pass out. These symptoms may be an emergency. Do not wait to see if the symptoms will go away. Get medical help right away. Call your local emergency services (911 in the [place] Do not drive yourself to the hospital. Summary  Hypoglycemia happens when the level of sugar (glucose) in your blood is too low.  Low blood sugar can happen to people who have diabetes and people who do not have diabetes. Low blood sugar can happen quickly, and it can be an emergency.  Make sure you know the signs of low blood sugar and know how to treat it.  Always keep a source of sugar (fast-acting carb) with you to treat low blood sugar. This information is not intended to replace advice given to you by your health care provider. Make sure you discuss any questions you have with your health care provider. Document Revised: 01/09/2019 Document Reviewed: 10/22/2015 Elsevier Patient Education  2020 [employer].

## 2019-11-04 NOTE — Assessment & Plan Note (Signed)
Elevated glucose 200-300 (fasting) C peptide >0.2, GAD65 elevated type1 vs type 2 insulin dependent? Unable to tolerate metformin and glipizide. Started lantus 10unit daily Pharmacy did not dispense novolin 70/30.  Increase lantus to 15units Start novolin 70/30 5units BID Advised about hypoglycemia. Maintain upcoming appt with endocrinology Encourage to schedule appt with nutritionist.

## 2019-11-04 NOTE — Progress Notes (Signed)
Virtual Visit via Video Note  I connected with@ on 11/04/19 at 10:30 AM EST by a video enabled telemedicine application and verified that I am speaking with the correct person using two identifiers.  Location: Patient:Home Provider: Office Participants: patient and provider  I discussed the limitations of evaluation and management by telemedicine and the availability of in person appointments. I also discussed with the patient that there may be a patient responsible charge related to this service. The patient expressed understanding and agreed to proceed.  CC:DM f/up  History of Present Illness: Home glucose readings for last 11days (fasting): >600, 361, 263, 371, 270, 369, 274, 231, 369, 274, 231. Denies any hypoglycemic episodes. No nausea or dysuria or ABD pain Persistent polyuria and polydipsia. Unable to check any other vital signs. Upcoming appt with endocrinology 11/11/2018. Plans to consult with nutritionist at endocrinology's office.  Observations/Objective: Physical Exam  Constitutional: She is oriented to person, place, and time.  Pulmonary/Chest: Effort normal.  Neurological: She is alert and oriented to person, place, and time.  Psychiatric: She has a normal mood and affect. Her behavior is normal. Thought content normal.   Assessment and Plan: Irys was seen today for follow-up.  Diagnoses and all orders for this visit:  Type 1 diabetes mellitus with hyperglycemia (HCC) -     insulin NPH-regular Human (NOVOLIN 70/30) (70-30) 100 UNIT/ML injection; Inject 5 Units into the skin 2 (two) times daily with a meal. Do not inject if glucose <150 -     INSULIN SYRINGE .5CC/29G 29G X 1/2" 0.5 ML MISC; 1 Units by Does not apply route 2 (two) times daily with a meal.   Follow Up Instructions: See avs   I discussed the assessment and treatment plan with the patient. The patient was provided an opportunity to ask questions and all were answered. The patient agreed with the plan  and demonstrated an understanding of the instructions.   The patient was advised to call back or seek an in-person evaluation if the symptoms worsen or if the condition fails to improve as anticipated.  Alysia Penna, NP

## 2019-11-10 ENCOUNTER — Other Ambulatory Visit: Payer: Self-pay

## 2019-11-12 ENCOUNTER — Ambulatory Visit (INDEPENDENT_AMBULATORY_CARE_PROVIDER_SITE_OTHER): Payer: 59 | Admitting: Internal Medicine

## 2019-11-12 ENCOUNTER — Other Ambulatory Visit: Payer: Self-pay

## 2019-11-12 ENCOUNTER — Encounter: Payer: Self-pay | Admitting: Internal Medicine

## 2019-11-12 VITALS — BP 116/78 | HR 78 | Temp 98.2°F | Ht 68.0 in | Wt 182.6 lb

## 2019-11-12 DIAGNOSIS — E1065 Type 1 diabetes mellitus with hyperglycemia: Secondary | ICD-10-CM | POA: Diagnosis not present

## 2019-11-12 LAB — GLUCOSE, POCT (MANUAL RESULT ENTRY): POC Glucose: 202 mg/dl — AB (ref 70–99)

## 2019-11-12 MED ORDER — INSULIN PEN NEEDLE 32G X 4 MM MISC: 1.0000 | 6 refills | Status: DC

## 2019-11-12 MED ORDER — NOVOLOG FLEXPEN 100 UNIT/ML ~~LOC~~ SOPN: 6.0000 [IU] | PEN_INJECTOR | Freq: Three times a day (TID) | SUBCUTANEOUS | 11 refills | Status: DC

## 2019-11-12 NOTE — Progress Notes (Signed)
Name: Carrie Hudson  MRN/ DOB: [id number], 01-01-1996   Age/ Sex: 24 y.o., female    PCP: Nche, Charlene Brooke, NP   Reason for Endocrinology Evaluation: Type 1 Diabetes Mellitus     Date of Initial Endocrinology Visit: 11/12/2019     PATIENT IDENTIFIER: [name] is a 24 y.o. female with a past medical history of T1DM. The patient presented for initial endocrinology clinic visit on 11/12/2019 for consultative assistance with her diabetes management.    HPI: [name] was    Diagnosed with T2DM  At age 23 but in 11/2019 her GAD-levels  Prior Medications tried/Intolerance: Metformin with persistent hyperglycemia. Recently switched lantus and Novolin  Currently checking blood sugars 1 x / day,  before breakfast.  Hypoglycemia episodes : no                Hemoglobin A1c has ranged from 11.5%in 2020, peaking at 14.2% in 2021. Patient required assistance for hypoglycemia: no Patient has required hospitalization within the last 1 year from hyper or hypoglycemia: no  In terms of diet, the patient eats 3 meals a day , snacks 2 x a day. Drinks sugar-sweetened beverages   Recenntly graduated from exercise scienece   HOME DIABETES REGIMEN: Lantus 15 units daily  Novolin 70/30 5 units BID ( breakfast and bedtime )    Statin: no ACE-I/ARB: no  Prior Diabetic Education: No   METER DOWNLOAD SUMMARY:  This AM 328 mg/dL      DIABETIC COMPLICATIONS: Microvascular complications:    Denies: neuropathy, retinopathy, CKD   Last eye exam: Completed long time ago   Macrovascular complications:    Denies: CAD, PVD, CVA   PAST HISTORY: Past Medical History:  Past Medical History:  Diagnosis Date  . Allergy   . Asthma   . Diabetes mellitus without complication [facility])    Past Surgical History:  Past Surgical History:  Procedure Laterality Date  . TONSILLECTOMY        Social History:  reports that she has never smoked. She has never used smokeless tobacco. She reports  current alcohol use of about 1.0 standard drinks of alcohol per week. She reports that she does not use drugs. Family History:  Family History  Problem Relation Age of Onset  . Diabetes Father   . Diabetes Paternal Grandfather   . Sickle cell anemia Brother      HOME MEDICATIONS: Allergies as of 11/12/2019   No Known Allergies     Medication List       Accurate as of November 12, 2019 11:12 AM. If you have any questions, ask your nurse or doctor.        albuterol 108 (90 Base) MCG/ACT inhaler Commonly known as: VENTOLIN HFA Inhale 1 puff into the lungs every 6 (six) hours as needed for shortness of breath.   blood glucose meter kit and supplies Kit Dispense based on patient and insurance preference. Check glucose before breakfast. E11.29   FLONASE NA Place into the nose.   Fluticasone-Salmeterol 250-50 MCG/DOSE Aepb Commonly known as: Advair Diskus Inhale 1 puff into the lungs 2 (two) times daily.   insulin glargine 100 unit/mL Sopn Commonly known as: LANTUS Inject 0.15 mLs (15 Units total) into the skin daily.   INSULIN SYRINGE .5CC/29G 29G X 1/2" 0.5 ML Misc 1 Units by Does not apply route 2 (two) times daily with a meal.   montelukast 10 MG tablet Commonly known as: SINGULAIR   NovoLIN 70/30 (70-30) 100  UNIT/ML injection Generic drug: insulin NPH-regular Human Inject 5 Units into the skin 2 (two) times daily with a meal. Do not inject if glucose <720   OneTouch Delica Lancets 94B Misc USE TO CHECK BLOOD GLUCOSE BEFORE BREAKFAST   OneTouch Ultra test strip Generic drug: glucose blood CHECK BLOOD GLUCOSE BEFORE BREAKFAST   Pen Needles 31G X 6 MM Misc 1 application by Does not apply route at bedtime.        ALLERGIES: No Known Allergies   REVIEW OF SYSTEMS: A comprehensive ROS was conducted with the patient and is negative except as per HPI and below:  ROS    OBJECTIVE:   VITAL SIGNS: BP 116/78 (BP Location: Left Arm, Patient Position:  Sitting, Cuff Size: Normal)   Pulse 78   Temp 98.2 F (36.8 C)   Ht '5\' 8"'  (1.727 m)   Wt 182 lb 9.6 oz (82.8 kg)   SpO2 99%   BMI 27.76 kg/m    PHYSICAL EXAM:  General: Pt appears well and is in NAD  HEENT: .  Eyes: External eye exam normal without stare, lid lag or exophthalmos.  EOM intact.   Neck: General: Supple without adenopathy or carotid bruits. Thyroid: Thyroid size normal.  No goiter or nodules appreciated. No thyroid bruit.  Lungs: Clear with good BS bilat with no rales, rhonchi, or wheezes  Heart: RRR with normal S1 and S2 and no gallops; no murmurs; no rub  Abdomen: Normoactive bowel sounds, soft, nontender, without masses or organomegaly palpable  Extremities:  Lower extremities - No pretibial edema. No lesions.  Skin: Normal texture and temperature to palpation. No rash noted. No Acanthosis nigricans/skin tags. No lipohypertrophy.  Neuro: MS is good with appropriate affect, pt is alert and Ox3    DM foot exam: 11/12/2019  The skin of the feet is intact without sores or ulcerations. The pedal pulses are 2+ on right and 2+ on left. The sensation is intact to a screening 5.07, 10 gram monofilament bilaterally   DATA REVIEWED:  Lab Results  Component Value Date   HGBA1C 14.2 (H) 10/14/2019   HGBA1C 11.5 (H) 07/04/2019   Lab Results  Component Value Date   MICROALBUR <0.7 10/14/2019   CREATININE 1.09 10/14/2019   Lab Results  Component Value Date   MICRALBCREAT 1.6 10/14/2019    Results for NELLENE, COURTOIS (MRN [id number]) as of 11/12/2019 08:26  Ref. Range 10/14/2019 12:23  Glutamic Acid Decarb Ab Latest Ref Range: <5 IU/mL 22 (H)    ASSESSMENT / PLAN / RECOMMENDATIONS:   1) Type 1 Diabetes Mellitus, Poorly controlled, Without complications - Most recent A1c of 14.2 %. Goal A1c < 7.0%.    Plan: GENERAL: I have discussed with the patient the pathophysiology of diabetes. We went over the natural progression of the disease. We talked about both insulin  resistance and insulin deficiency. We stressed the importance of lifestyle changes including diet and exercise. I explained the complications associated with diabetes including retinopathy, nephropathy, neuropathy as well as increased risk of cardiovascular disease. We went over the benefit seen with glycemic control.    I explained to the patient that diabetic patients are at higher than normal risk for amputations.   Patient will be referred to our RD for further education  We discussed that she is currently in the honeymoon.  Because she is not requiring high doses of insulin, and no history of DKA, I did explain to her that over the years that insulin reserve will  be depleted and she will be at high risk for DKA if she is not compliant with taking her insulin regimen, we also discussed that in the future we will be discussing more of the diabetes technology that is available.  We will adjust her insulin regimen as below Discussed pharmacokinetics of basal/bolus insulin and the importance of taking prandial insulin with meals.   We also discussed avoiding sugar-sweetened beverages and snacks, when possible.    MEDICATIONS: - Increase Lantus to 16 units  - Stop Novolin 70/30 - Start Novolog 6 units with each meal  - If your pre-meal glucose is over 200, take 8 units of Novolog with that meal    EDUCATION / INSTRUCTIONS:  BG monitoring instructions: Patient is instructed to check her blood sugars 3 times a day, before meals.  Call [facility] if: BG persistently < 70 or > 300. . I reviewed the Rule of 15 for the treatment of hypoglycemia in detail with the patient. Literature supplied.   2) Diabetic complications:   Eye: Does not have known diabetic retinopathy.  She was strongly encouraged to have an eye exam  Neuro/ Feet: Does not have known diabetic peripheral neuropathy.  Renal: Patient does not have known baseline CKD. She is not on an ACEI/ARB at  present.she is up-to-date on urine albumin/creatinine ratio    45 minutes were spent with the patient discussing her new diagnosis natural history and prognosis.     Signed electronically by: Mack Guise, MD  [facility] End[facility]in[facility]  [facility] [address] [place], [place] [address] Phone: [phone] FAX: [phone]   CC: Floss[address]y, [place] [address] Phone: [phone]  Fax: [phone]    Return to Endocrinology clinic as below: Future Appointments  Date Time Provider [place]  11/28/2019  2:30 PM Valora Piccolo Barnabas Lister, RD [place] [facility]

## 2019-11-12 NOTE — Patient Instructions (Addendum)
-   Increase Lantus to 16 units  - Stop Novolin 70/30 - Start Novolog 6 units with each meal  - If your pre-meal glucose is over 200, take 8 units of Novolog with that meal    - Check sugar before each meal when you can, bring meter on next visit   - Choose healthy, lower carb lower calorie snacks: toss salad,vegetables, cottage cheese, peanut butter, low fat cheese / string cheese, lower sodium deli meat, tuna salad or chicken salad   - HOW TO TREAT LOW BLOOD SUGARS (Blood sugar LESS THAN 70 MG/DL)  Please follow the RULE OF 15 for the treatment of hypoglycemia treatment (when your (blood sugars are less than 70 mg/dL)    STEP 1: Take 15 grams of carbohydrates when your blood sugar is low, which includes:   3-4 GLUCOSE TABS  OR  3-4 OZ OF JUICE OR REGULAR SODA OR  ONE TUBE OF GLUCOSE GEL     STEP 2: RECHECK blood sugar in 15 MINUTES STEP 3: If your blood sugar is still low at the 15 minute recheck --> then, go back to STEP 1 and treat AGAIN with another 15 grams of carbohydrates.

## 2019-11-28 ENCOUNTER — Encounter: Payer: 59 | Attending: Nurse Practitioner | Admitting: Dietician

## 2020-01-15 ENCOUNTER — Other Ambulatory Visit: Payer: Self-pay | Admitting: Nurse Practitioner

## 2020-02-09 ENCOUNTER — Ambulatory Visit: Payer: 59 | Admitting: Internal Medicine

## 2020-02-09 DIAGNOSIS — Z0289 Encounter for other administrative examinations: Secondary | ICD-10-CM

## 2020-02-09 NOTE — Progress Notes (Deleted)
Name: Carrie Hudson  Age/ Sex: 24 y.o., female   MRN/ DOB: [id number], 1996-05-28     PCP: Flossie Buffy, NP   Reason for Endocrinology Evaluation: Type 1 Diabetes Mellitus  Initial Endocrine Consultative Visit: 11/12/2019    PATIENT IDENTIFIER: [name] is a 24 y.o. female with a past medical history of T1DM.  The patient has followed with Endocrinology clinic since 11/12/2019 for consultative assistance with management of her diabetes.  DIABETIC HISTORY:  Carrie Hudson was diagnosed with T1DM at age 76 by by 11/2019 her diagnosis was changed to T1DM due to elevated GAD-65 levels at 22 IU/mL and that's when she was switched from Metformin to MDI regimen . Her hemoglobin A1c has ranged from 11.5%in 2020, peaking at 14.2% in 2021.   On her initial visit to our clinic she was on a combination of basal insulin and an Insulin MIX. We switched the Novolog Mix to plain Novolog and increased basal insulin.   SUBJECTIVE:   During the last visit (11/12/2019): A1c 14.2% . We increased Basaglar, stopped Novolog mix and started [employer].   Today (02/09/2020): Carrie Hudson is here for a follow up on diabetes management.   She checks her blood sugars *** times daily, preprandial to breakfast and ***. The patient has *** had hypoglycemic episodes since the last clinic visit, which typically occur *** x / - most often occuring ***. The patient is *** symptomatic with these episodes, with symptoms of {symptoms; hypoglycemia:9084048}. Otherwise, the patient has not required any recent emergency interventions for hypoglycemia and has not  had recent hospitalizations secondary to hyper or hypoglycemic episodes.    ROS: As per HPI and as detailed below: ROS    HOME DIABETES REGIMEN:  Lantus 16 units daily  Novolog 6 units TID QAC CF: Novolog, add 2 units to premeal BG if > 200 mg/dL   Statin: no ACE-I/ARB: no    METER DOWNLOAD SUMMARY: Date range evaluated: *** Fingerstick Blood Glucose Tests =  *** Average Number Tests/Day = *** Overall Mean FS Glucose = *** Standard Deviation = ***  BG Ranges: Low = *** High = ***   Hypoglycemic Events/30 Days: BG < 50 = *** Episodes of symptomatic severe hypoglycemia = ***   DIABETIC COMPLICATIONS: Microvascular complications:    Denies: neuropathy, retinopathy, CKD   Last eye exam: Completed long time ago   Macrovascular complications:    Denies: CAD, PVD, CVA    HISTORY:  Past Medical History:  Past Medical History:  Diagnosis Date  . Allergy   . Asthma   . Diabetes mellitus without complication [facility])     Past Surgical History:  Past Surgical History:  Procedure Laterality Date  . TONSILLECTOMY       Social History:  reports that she has never smoked. She has never used smokeless tobacco. She reports current alcohol use of about 1.0 standard drinks of alcohol per week. She reports that she does not use drugs. Family History:  Family History  Problem Relation Age of Onset  . Diabetes Father   . Diabetes Paternal Grandfather   . Sickle cell anemia Brother       HOME MEDICATIONS: Allergies as of 02/09/2020   No Known Allergies     Medication List       Accurate as of Feb 09, 2020  7:25 AM. If you have any questions, ask your nurse or doctor.        albuterol 108 (90 Base) MCG/ACT inhaler  Commonly known as: VENTOLIN HFA Inhale 1 puff into the lungs every 6 (six) hours as needed for shortness of breath.   blood glucose meter kit and supplies Kit Dispense based on patient and insurance preference. Check glucose before breakfast. E11.29   FLONASE NA Place into the nose.   Fluticasone-Salmeterol 250-50 MCG/DOSE Aepb Commonly known as: Advair Diskus Inhale 1 puff into the lungs 2 (two) times daily.   insulin glargine 100 unit/mL Sopn Commonly known as: LANTUS Inject 16 Units into the skin daily.   Insulin Pen Needle 32G X 4 MM Misc 1 Device by Does not apply route as directed.    montelukast 10 MG tablet Commonly known as: SINGULAIR   NovoLOG FlexPen 100 UNIT/ML FlexPen Generic drug: insulin aspart Inject 6 Units into the skin 3 (three) times daily with meals.   OneTouch Delica Lancets 27P Misc USE TO CHECK BLOOD GLUCOSE BEFORE BREAKFAST   OneTouch Ultra test strip Generic drug: glucose blood CHECK BLOOD GLUCOSE BEFORE BREAKFAST        OBJECTIVE:   Vital Signs: There were no vitals taken for this visit.  Wt Readings from Last 3 Encounters:  11/12/19 182 lb 9.6 oz (82.8 kg)  10/14/19 181 lb 3.2 oz (82.2 kg)  07/17/19 215 lb (97.5 kg)     Exam: General: Pt appears well and is in NAD  Lungs: Clear with good BS bilat with no rales, rhonchi, or wheezes  Heart: RRR with normal S1 and S2 and no gallops; no murmurs; no rub  Abdomen: Normoactive bowel sounds, soft, nontender, without masses or organomegaly palpable  Extremities: No pretibial edema. No tremor. Normal strength and motion throughout. See detailed diabetic foot exam below.  Skin: Normal texture and temperature to palpation.  Neuro: MS is good with appropriate affect, pt is alert and Ox3        DM foot exam: 11/12/2019  The skin of the feet is intact without sores or ulcerations. The pedal pulses are 2+ on right and 2+ on left. The sensation is intact to a screening 5.07, 10 gram monofilament bilaterally    DATA REVIEWED:  Lab Results  Component Value Date   HGBA1C 14.2 (H) 10/14/2019   HGBA1C 11.5 (H) 07/04/2019   Lab Results  Component Value Date   MICROALBUR <0.7 10/14/2019   CREATININE 1.09 10/14/2019   Lab Results  Component Value Date   MICRALBCREAT 1.6 10/14/2019     Results for Carrie Hudson, Carrie Hudson (MRN [id number]) as of 11/12/2019 08:26  Ref. Range 10/14/2019 12:23  Glutamic Acid Decarb Ab Latest Ref Range: <5 IU/mL 22 (H)     ASSESSMENT / PLAN / RECOMMENDATIONS:   1) Type 1 Diabetes Mellitus, Poorly controlled, Without complications - Most recent A1c of 14.2 %.  Goal A1c < 7.0%.    Plan: MEDICATIONS:  ***  EDUCATION / INSTRUCTIONS:  BG monitoring instructions: Patient is instructed to check her blood sugars *** times a day, ***.  Call [facility] if: BG persistently < 70 or > 300. . I reviewed the Rule of 15 for the treatment of hypoglycemia in detail with the patient. Literature supplied.    2) Diabetic complications:   Eye: Does *** have known diabetic retinopathy.   Neuro/ Feet: Does *** have known diabetic peripheral neuropathy .   Renal: Patient does *** have known baseline CKD. She   is *** on an ACEI/ARB at present. Check urine albumin/creatinine ratio yearly starting at time of diagnosis. If albuminuria is positive, treatment is  geared toward better glucose, blood pressure control and use of ACE inhibitors or ARBs. Monitor electrolytes and creatinine once to twice yearly.    F/U in ***    Signed electronically by: Mack Guise, MD  [facility]  [facility] [address] [place], [place] [address] Phone: [phone] FAX: [phone]   CC: Flossie Buffy, [place] [address] Phone: [phone]  Fax: [phone]  Return to Endocrinology clinic as below: Future Appointments  Date Time Provider [place]  02/09/2020 10:30 AM Mikhai Bienvenue, Melanie Crazier, MD [facility] None

## 2020-08-04 ENCOUNTER — Other Ambulatory Visit: Payer: Self-pay | Admitting: Internal Medicine

## 2020-08-05 NOTE — Telephone Encounter (Signed)
Ok to change?  Last prescribed on 11/12/2019. Last seen on 11/12/2019.

## 2020-12-02 ENCOUNTER — Other Ambulatory Visit: Payer: Self-pay

## 2020-12-02 ENCOUNTER — Encounter: Payer: Self-pay | Admitting: Emergency Medicine

## 2020-12-02 ENCOUNTER — Ambulatory Visit
Admission: EM | Admit: 2020-12-02 | Discharge: 2020-12-02 | Disposition: A | Discharge status: Home / Self Care | Payer: No Typology Code available for payment source | Attending: Family Medicine | Admitting: Family Medicine

## 2020-12-02 DIAGNOSIS — S161XXA Strain of muscle, fascia and tendon at neck level, initial encounter: Secondary | ICD-10-CM

## 2020-12-02 DIAGNOSIS — S5012XA Contusion of left forearm, initial encounter: Secondary | ICD-10-CM

## 2020-12-02 DIAGNOSIS — S20212A Contusion of left front wall of thorax, initial encounter: Secondary | ICD-10-CM

## 2020-12-02 DIAGNOSIS — S01112A Laceration without foreign body of left eyelid and periocular area, initial encounter: Secondary | ICD-10-CM | POA: Diagnosis not present

## 2020-12-02 DIAGNOSIS — J4531 Mild persistent asthma with (acute) exacerbation: Secondary | ICD-10-CM

## 2020-12-02 DIAGNOSIS — S0083XA Contusion of other part of head, initial encounter: Secondary | ICD-10-CM | POA: Diagnosis not present

## 2020-12-02 MED ORDER — HIBICLENS 4 % EX LIQD: Freq: Every day | CUTANEOUS | 0 refills | Status: DC | PRN

## 2020-12-02 MED ORDER — ALBUTEROL SULFATE HFA 108 (90 BASE) MCG/ACT IN AERS: 1.0000 | INHALATION_SPRAY | Freq: Four times a day (QID) | RESPIRATORY_TRACT | 0 refills | Status: DC | PRN
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
## 2020-12-02 NOTE — Discharge Instructions (Signed)
Good directly to the ER for further evaluation if symptoms worsening at any point time
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
## 2020-12-04 ENCOUNTER — Emergency Department (HOSPITAL_BASED_OUTPATIENT_CLINIC_OR_DEPARTMENT_OTHER)
Admission: EM | Admit: 2020-12-04 | Discharge: 2020-12-04 | Disposition: A | Discharge status: Home / Self Care | Payer: No Typology Code available for payment source | Attending: Emergency Medicine | Admitting: Emergency Medicine

## 2020-12-04 ENCOUNTER — Encounter (HOSPITAL_BASED_OUTPATIENT_CLINIC_OR_DEPARTMENT_OTHER): Payer: Self-pay | Admitting: *Deleted

## 2020-12-04 ENCOUNTER — Other Ambulatory Visit: Payer: Self-pay

## 2020-12-04 DIAGNOSIS — S01112A Laceration without foreign body of left eyelid and periocular area, initial encounter: Secondary | ICD-10-CM | POA: Diagnosis not present

## 2020-12-04 DIAGNOSIS — Y92410 Unspecified street and highway as the place of occurrence of the external cause: Secondary | ICD-10-CM | POA: Diagnosis not present

## 2020-12-04 DIAGNOSIS — E119 Type 2 diabetes mellitus without complications: Secondary | ICD-10-CM | POA: Diagnosis not present

## 2020-12-04 DIAGNOSIS — J4530 Mild persistent asthma, uncomplicated: Secondary | ICD-10-CM | POA: Diagnosis not present

## 2020-12-04 DIAGNOSIS — Z23 Encounter for immunization: Secondary | ICD-10-CM | POA: Diagnosis not present

## 2020-12-04 DIAGNOSIS — Z794 Long term (current) use of insulin: Secondary | ICD-10-CM | POA: Insufficient documentation

## 2020-12-04 DIAGNOSIS — S0592XA Unspecified injury of left eye and orbit, initial encounter: Secondary | ICD-10-CM | POA: Diagnosis present

## 2020-12-04 MED ORDER — TETANUS-DIPHTH-ACELL PERTUSSIS 5-2.5-18.5 LF-MCG/0.5 IM SUSY
0.5000 mL | PREFILLED_SYRINGE | Freq: Once | INTRAMUSCULAR | Status: AC
  Administered 2020-12-04: 0.5 mL via INTRAMUSCULAR
  Filled 2020-12-04: qty 0.5

## 2020-12-04 MED ORDER — ERYTHROMYCIN 5 MG/GM OP OINT: TOPICAL_OINTMENT | Freq: Three times a day (TID) | OPHTHALMIC | 2 refills | Status: DC

## 2020-12-04 MED ORDER — FLUORESCEIN SODIUM 1 MG OP STRP
1.0000 | ORAL_STRIP | Freq: Once | OPHTHALMIC | Status: AC
  Administered 2020-12-04: 1 via OPHTHALMIC

## 2020-12-04 MED ORDER — TETRACAINE HCL 0.5 % OP SOLN
2.0000 [drp] | Freq: Once | OPHTHALMIC | Status: AC
  Administered 2020-12-04: 2 [drp] via OPHTHALMIC
  Filled 2020-12-04: qty 4
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
## 2020-12-04 NOTE — ED Notes (Signed)
ED Provider at bedside. 

## 2020-12-04 NOTE — ED Notes (Signed)
Pt discharged to home. Discharge instructions have been discussed with patient and/or family members. Pt verbally acknowledges understanding d/c instructions, and endorses comprehension to checkout at registration before leaving.  °

## 2020-12-04 NOTE — Discharge Instructions (Addendum)
Use warm water on a washcloth to gently clean area. Apply Erythromycin ointment as prescribed.  Plan is for Dr. Vanessa Barbara with ophthalmology to call you on Monday to arrange follow up with plastic surgery specialist for your eye wound, possibly with [facility].

## 2020-12-14 DIAGNOSIS — M25511 Pain in right shoulder: Secondary | ICD-10-CM | POA: Insufficient documentation

## 2020-12-17 ENCOUNTER — Other Ambulatory Visit: Payer: Self-pay | Admitting: Orthopedic Surgery

## 2020-12-17 ENCOUNTER — Other Ambulatory Visit (HOSPITAL_COMMUNITY): Payer: Self-pay | Admitting: Orthopedic Surgery

## 2020-12-17 DIAGNOSIS — M25511 Pain in right shoulder: Secondary | ICD-10-CM

## 2020-12-23 ENCOUNTER — Other Ambulatory Visit: Payer: Self-pay | Admitting: Family Medicine

## 2020-12-23 DIAGNOSIS — J4531 Mild persistent asthma with (acute) exacerbation: Secondary | ICD-10-CM

## 2020-12-25 ENCOUNTER — Other Ambulatory Visit: Payer: Self-pay

## 2020-12-25 ENCOUNTER — Ambulatory Visit (HOSPITAL_BASED_OUTPATIENT_CLINIC_OR_DEPARTMENT_OTHER)
Admission: RE | Admit: 2020-12-25 | Discharge: 2020-12-25 | Disposition: A | Discharge status: Home / Self Care | Payer: No Typology Code available for payment source | Source: Ambulatory Visit | Attending: Orthopedic Surgery | Admitting: Orthopedic Surgery

## 2020-12-25 DIAGNOSIS — M25511 Pain in right shoulder: Secondary | ICD-10-CM | POA: Diagnosis present

## 2021-02-19 ENCOUNTER — Other Ambulatory Visit: Payer: Self-pay | Admitting: Internal Medicine

## 2022-02-05 NOTE — Progress Notes (Signed)
?Subjective:  ? ? Carrie Hudson - 26 y.o. female MRN [id number]  Date of birth: [birth year] ? ?HPI ? ?Carrie Hudson is a 26 year-old female to establish care.  ? ?Current issues and/or concerns: ?Diabetes type 1:  ?11/04/2019 [facility] [place] with Alysia Pennaharlotte Nche, NP: ?-     insulin NPH-regular Human (NOVOLIN 70/30) (70-30) 100 UNIT/ML injection; Inject 5 Units into the skin 2 (two) times daily with a meal. Do not inject if glucose <150 ?-     INSULIN SYRINGE .5CC/29G 29G X 1/2" 0.5 ML MISC; 1 Units by Does not apply route 2 (two) times daily with a meal. ? ?11/12/2019 [facility] with Terrace ArabiaIbtehal Shamleffer, MD: ?1) Type 1 Diabetes Mellitus, Poorly controlled, Without complications - Most recent A1c of 14.2 %. Goal A1c < 7.0%.   ?  ?Plan: ?GENERAL: ?I have discussed with the patient the pathophysiology of diabetes. We went over the natural progression of the disease. We talked about both insulin resistance and insulin deficiency. We stressed the importance of lifestyle changes including diet and exercise. I explained the complications associated with diabetes including retinopathy, nephropathy, neuropathy as well as increased risk of cardiovascular disease. We went over the benefit seen with glycemic control.  ?  ?I explained to the patient that diabetic patients are at higher than normal risk for amputations.  ?Patient will be referred to our RD for further education ?We discussed that she is currently in the honeymoon.  Because she is not requiring high doses of insulin, and no history of DKA, I did explain to her that over the years that insulin reserve will be depleted and she will be at high risk for DKA if she is not compliant with taking her insulin regimen, we also discussed that in the future we will be discussing more of the diabetes technology that is available. ?We will adjust her insulin regimen as below ?Discussed pharmacokinetics of basal/bolus insulin and the importance of taking  prandial insulin with meals.  ?We also discussed avoiding sugar-sweetened beverages and snacks, when possible.  ?  ?  ?MEDICATIONS: ?- Increase Lantus to 16 units  ?- Stop Novolin 70/30 ?- Start Novolog 6 units with each meal  ?- If your pre-meal glucose is over 200, take 8 units of Novolog with that meal  ? ?Today's visit 02/08/2022: ?Reports no longer followed by Endocrinology. At least 12 months without taking insulin. Recently began taking insulin about once weekly. Reports she still has insulin at home that is in good date so no refills needed at the moment. She is not checking blood sugars at home but has testing supplies to do so. Trying to monitor carb intake. Not exercising outside of normal routine.  ? ?2. Painful periods: ?Reports painful periods with symptoms such as cramping, heavy bleeding, and blood clots. She is not sexually active currently. Would like to try Depo injection to help with symptoms. She has not had Depo in the past.  ? ? ?ROS per HPI  ? ? ? ?Health Maintenance:  ?Health Maintenance Due  ?Topic Date Due  ? OPHTHALMOLOGY EXAM  Never done  ? HPV VACCINES (1 - 2-dose series) Never done  ? HIV Screening  Never done  ? Hepatitis C Screening  Never done  ? PAP-Cervical Cytology Screening  Never done  ? PAP SMEAR-Modifier  Never done  ? COVID-19 Vaccine (2 - Pfizer series) 08/02/2020  ? URINE MICROALBUMIN  10/13/2020  ? FOOT EXAM  11/11/2020  ? ? ? ?  Past Medical History: ?Patient Active Problem List  ? Diagnosis Date Noted  ? Pain in joint of right shoulder 12/14/2020  ? Sickle cell trait (HCC) 07/04/2019  ? Type 1 diabetes mellitus with hyperglycemia (HCC) 07/04/2019  ? Mild persistent asthma with acute exacerbation 07/04/2019  ? ? ? ?Social History  ? reports that she has never smoked. She has never been exposed to tobacco smoke. She has never used smokeless tobacco. She reports current alcohol use of about 1.0 standard drink per week. She reports that she does not use drugs.  ? ?Family  History  ?family history includes Diabetes in her father and paternal grandfather; Sickle cell anemia in her brother.  ? ?Medications: reviewed and updated ?  ?Objective:  ? Physical Exam ?BP 116/80 (BP Location: Left Arm, Patient Position: Sitting, Cuff Size: Normal)   Pulse 96   Temp 98.3 ?F (36.8 ?C)   Resp 18   Ht 5' 8.11" (1.73 m)   Wt 170 lb (77.1 kg)   SpO2 96%   BMI 25.76 kg/m?  ? ?Physical Exam ?HENT:  ?   Head: Normocephalic and atraumatic.  ?Eyes:  ?   Extraocular Movements: Extraocular movements intact.  ?   Conjunctiva/sclera: Conjunctivae normal.  ?   Pupils: Pupils are equal, round, and reactive to light.  ?Cardiovascular:  ?   Rate and Rhythm: Normal rate and regular rhythm.  ?   Pulses: Normal pulses.  ?   Heart sounds: Normal heart sounds.  ?Pulmonary:  ?   Effort: Pulmonary effort is normal.  ?   Breath sounds: Normal breath sounds.  ?Musculoskeletal:  ?   Cervical back: Normal range of motion and neck supple.  ?Neurological:  ?   General: No focal deficit present.  ?   Mental Status: She is alert and oriented to person, place, and time.  ?Psychiatric:     ?   Mood and Affect: Mood normal.     ?   Behavior: Behavior normal.  ? ?  ?Results for orders placed or performed in visit on 02/08/22  ?POCT glycosylated hemoglobin (Hb A1C)  ?Result Value Ref Range  ? Hemoglobin A1C 12.8 (A) 4.0 - 5.6 %  ? HbA1c POC (<> result, manual entry)    ? HbA1c, POC (prediabetic range)    ? HbA1c, POC (controlled diabetic range)    ?POCT urine pregnancy  ?Result Value Ref Range  ? Preg Test, Ur Negative Negative  ? ? ?Assessment & Plan:  ?1. Encounter to establish care: ?- Patient presents today to establish care.  ?- Return for annual physical examination, labs, and health maintenance. Arrive fasting meaning having no food for at least 8 hours prior to appointment. You may have only water or black coffee. Please take scheduled medications as normal. ? ?2. Uncontrolled type 1 diabetes mellitus with  hyperglycemia (HCC): ?3. Nonadherence to medication: ?- Hemoglobin A1c today not at goal at 12.8%, goal < 7%. However, this is improved from previous 14.2% on 10/14/2019. ?- Patient reports 12 months without taking insulin. Recently began taking again about once weekly.  ?- Resume Insulin Glargine and Insulin Lispro as prescribed. No refills needed as of present. ?- Discussed the importance of healthy eating habits, low-carbohydrate diet, low-sugar diet, regular aerobic exercise (at least 150 minutes a week as tolerated) and medication compliance to achieve or maintain control of diabetes. ?- Referral to Endocrinology for further evaluation and management.  ?- Ambulatory referral to Endocrinology ?- POCT glycosylated hemoglobin (Hb A1C) ? ?4. Dysmenorrhea: ?  5. Encounter for management and injection of depo-Provera: ?6. Initiation of Depo Provera: ?- Urine pregnancy negative. ?- Medroxyprogesterone injection as prescribed. Counseled on medication adherence and adverse effects.  ?- Follow-up in 12 weeks or sooner if needed.  ?- medroxyPROGESTERone (DEPO-PROVERA) injection 150 mg ?- POCT urine pregnancy ? ? ? ? ?Patient was given clear instructions to go to Emergency Department or return to medical center if symptoms don't improve, worsen, or new problems develop.The patient verbalized understanding. ? ?I discussed the assessment and treatment plan with the patient. The patient was provided an opportunity to ask questions and all were answered. The patient agreed with the plan and demonstrated an understanding of the instructions. ?  ?The patient was advised to call back or seek an in-person evaluation if the symptoms worsen or if the condition fails to improve as anticipated. ? ? ? ?Ricky Stabs, NP ?02/08/2022, 11:57 AM ?Primary Care at [facility]  ? ?

## 2022-02-08 ENCOUNTER — Ambulatory Visit (INDEPENDENT_AMBULATORY_CARE_PROVIDER_SITE_OTHER): Payer: No Typology Code available for payment source | Admitting: Family

## 2022-02-08 ENCOUNTER — Encounter: Payer: Self-pay | Admitting: Family

## 2022-02-08 VITALS — BP 116/80 | HR 96 | Temp 98.3°F | Resp 18 | Ht 68.11 in | Wt 170.0 lb

## 2022-02-08 DIAGNOSIS — Z91148 Patient's other noncompliance with medication regimen for other reason: Secondary | ICD-10-CM | POA: Diagnosis not present

## 2022-02-08 DIAGNOSIS — Z3042 Encounter for surveillance of injectable contraceptive: Secondary | ICD-10-CM

## 2022-02-08 DIAGNOSIS — Z3202 Encounter for pregnancy test, result negative: Secondary | ICD-10-CM | POA: Diagnosis not present

## 2022-02-08 DIAGNOSIS — N946 Dysmenorrhea, unspecified: Secondary | ICD-10-CM | POA: Diagnosis not present

## 2022-02-08 DIAGNOSIS — Z7689 Persons encountering health services in other specified circumstances: Secondary | ICD-10-CM

## 2022-02-08 DIAGNOSIS — Z30013 Encounter for initial prescription of injectable contraceptive: Secondary | ICD-10-CM | POA: Diagnosis not present

## 2022-02-08 DIAGNOSIS — E1065 Type 1 diabetes mellitus with hyperglycemia: Secondary | ICD-10-CM

## 2022-02-08 LAB — POCT URINE PREGNANCY: Preg Test, Ur: NEGATIVE

## 2022-02-08 LAB — POCT GLYCOSYLATED HEMOGLOBIN (HGB A1C): Hemoglobin A1C: 12.8 % — AB (ref 4.0–5.6)

## 2022-02-08 MED ORDER — MEDROXYPROGESTERONE ACETATE 150 MG/ML IM SUSP
150.0000 mg | Freq: Once | INTRAMUSCULAR | Status: AC
  Administered 2022-02-08: 150 mg via INTRAMUSCULAR

## 2022-02-08 NOTE — Patient Instructions (Signed)
Medroxyprogesterone Injection (Contraception) ?What is this medication? ?MEDROXYPROGESTERONE (me DROX ee proe JES te rone) prevents ovulation and pregnancy. It belongs to a group of medications called contraceptives. This medication is a progestin hormone. ?This medicine may be used for other purposes; ask your health care provider or pharmacist if you have questions. ?COMMON BRAND NAME(S): Depo-Provera, Depo-subQ Provera 104 ?What should I tell my care team before I take this medication? ?They need to know if you have any of these conditions: ?Asthma ?Blood clots ?Breast cancer or family history of breast cancer ?Depression ?Diabetes ?Eating disorder (anorexia nervosa) ?Heart attack ?High blood pressure ?HIV infection or AIDS ?If you often drink alcohol ?Kidney disease ?Liver disease ?Migraine headaches ?Osteoporosis, weak bones ?Seizures ?Stroke ?Tobacco smoker ?Vaginal bleeding ?An unusual or allergic reaction to medroxyprogesterone, other hormones, medications, foods, dyes, or preservatives ?Pregnant or trying to get pregnant ?Breast-feeding ?How should I use this medication? ?Depo-Provera CI contraceptive injection is given into a muscle. Depo-subQ Provera 104 injection is given under the skin. It is given in a hospital or clinic setting. The injection is usually given during the first 5 days after the start of a menstrual period or 6 weeks after delivery of a baby. ?A patient package insert for the product will be given with each prescription and refill. Be sure to read this information carefully each time. The sheet may change often. ?Talk to your care team about the use of this medication in children. Special care may be needed. These injections have been used in female children who have started having menstrual periods. ?Overdosage: If you think you have taken too much of this medicine contact a poison control center or emergency room at once. ?NOTE: This medicine is only for you. Do not share this medicine  with others. ?What if I miss a dose? ?Keep appointments for follow-up doses. You must get an injection once every 3 months. It is important not to miss your dose. Call your care team if you are unable to keep an appointment. ?What may interact with this medication? ?Antibiotics or medications for infections, especially rifampin and griseofulvin ?Antivirals for HIV or hepatitis ?Aprepitant ?Armodafinil ?Bexarotene ?Bosentan ?Medications for seizures like carbamazepine, felbamate, oxcarbazepine, phenytoin, phenobarbital, primidone, topiramate ?Mitotane ?Modafinil ?St. John's wort ?This list may not describe all possible interactions. Give your health care provider a list of all the medicines, herbs, non-prescription drugs, or dietary supplements you use. Also tell them if you smoke, drink alcohol, or use illegal drugs. Some items may interact with your medicine. ?What should I watch for while using this medication? ?This medication does not protect you against HIV infection (AIDS) or other sexually transmitted diseases. ?Use of this product may cause you to lose calcium from your bones. Loss of calcium may cause weak bones (osteoporosis). Only use this product for more than 2 years if other forms of birth control are not right for you. The longer you use this product for birth control the more likely you will be at risk for weak bones. Ask your care team how you can keep strong bones. ?You may have a change in bleeding pattern or irregular periods. Many females stop having periods while taking this medication. ?If you have received your injections on time, your chance of being pregnant is very low. If you think you may be pregnant, see your care team as soon as possible. ?Tell your care team if you want to get pregnant within the next year. The effect of this medication may last a   long time after you get your last injection. ?What side effects may I notice from receiving this medication? ?Side effects that you should  report to your care team as soon as possible: ?Allergic reactions--skin rash, itching, hives, swelling of the face, lips, tongue, or throat ?Blood clot--pain, swelling, or warmth in the leg, shortness of breath, chest pain ?Gallbladder problems--severe stomach pain, nausea, vomiting, fever ?Increase in blood pressure ?Liver injury--right upper belly pain, loss of appetite, nausea, light-colored stool, dark yellow or brown urine, yellowing skin or eyes, unusual weakness or fatigue ?New or worsening migraines or headaches ?Seizures ?Stroke--sudden numbness or weakness of the face, arm, or leg, trouble speaking, confusion, trouble walking, loss of balance or coordination, dizziness, severe headache, change in vision ?Unusual vaginal discharge, itching, or odor ?Worsening mood, feelings of depression ?Side effects that usually do not require medical attention (report to your care team if they continue or are bothersome): ?Breast pain or tenderness ?Dark patches of the skin on the face or other sun-exposed areas ?Irregular menstrual cycles or spotting ?Nausea ?Weight gain ?This list may not describe all possible side effects. Call your doctor for medical advice about side effects. You may report side effects to FDA at 1-800-FDA-1088. ?Where should I keep my medication? ?This injection is only given by a care team. It will not be stored at home. ?NOTE: This sheet is a summary. It may not cover all possible information. If you have questions about this medicine, talk to your doctor, pharmacist, or health care provider. ?? 2023 Elsevier/Gold Standard (2020-11-21 00:00:00) ? ?

## 2022-02-08 NOTE — Progress Notes (Signed)
Diabetes discussed in office.

## 2022-02-08 NOTE — Progress Notes (Signed)
Urine pregnancy negative  

## 2022-02-08 NOTE — Progress Notes (Signed)
Pt presents to establish care  ?Request to start depo provera for birth control ?

## 2022-05-12 ENCOUNTER — Telehealth: Payer: No Typology Code available for payment source | Admitting: Physician Assistant

## 2022-05-12 DIAGNOSIS — H60331 Swimmer's ear, right ear: Secondary | ICD-10-CM

## 2022-05-12 MED ORDER — NEOMYCIN-POLYMYXIN-HC 3.5-10000-1 OT SOLN: 3.0000 [drp] | Freq: Four times a day (QID) | OTIC | 0 refills | Status: DC

## 2022-05-12 NOTE — Progress Notes (Signed)
Virtual Visit Consent   Carrie Hudson, you are scheduled for a virtual visit with a [facility] provider today. Just as with appointments in the office, your consent must be obtained to participate. Your consent will be active for this visit and any virtual visit you may have with one of our providers in the next 365 days. If you have a MyChart account, a copy of this consent can be sent to you electronically.  As this is a virtual visit, video technology does not allow for your provider to perform a traditional examination. This may limit your provider's ability to fully assess your condition. If your provider identifies any concerns that need to be evaluated in person or the need to arrange testing (such as labs, EKG, etc.), we will make arrangements to do so. Although advances in technology are sophisticated, we cannot ensure that it will always work on either your end or our end. If the connection with a video visit is poor, the visit may have to be switched to a telephone visit. With either a video or telephone visit, we are not always able to ensure that we have a secure connection.  By engaging in this virtual visit, you consent to the provision of healthcare and authorize for your insurance to be billed (if applicable) for the services provided during this visit. Depending on your insurance coverage, you may receive a charge related to this service.  I need to obtain your verbal consent now. Are you willing to proceed with your visit today? [name] has provided verbal consent on 05/12/2022 for a virtual visit (video or telephone). Mar Daring, PA-C  Date: 05/12/2022 9:52 AM  Virtual Visit via Video Note   I, Mar Daring, connected with  Carrie Hudson  ([id number], [birth year]) on 05/12/22 at  9:45 AM EDT by a video-enabled telemedicine application and verified that I am speaking with the correct person using two identifiers.  Location:  Patient: Virtual Visit Location Patient:  Home Provider: Virtual Visit Location Provider: Home Office   I discussed the limitations of evaluation and management by telemedicine and the availability of in person appointments. The patient expressed understanding and agreed to proceed.    History of Present Illness: Carrie Hudson is a 26 y.o. who identifies as a female who was assigned female at birth, and is being seen today for ear pain.  HPI: Otalgia  There is pain in the right ear. This is a new problem. Episode onset: went swimming about a week ago; symptoms started day after swimming. The problem occurs constantly. The problem has been gradually worsening. There has been no fever. The pain is moderate. Associated symptoms include hearing loss. Pertinent negatives include no coughing, ear discharge, headaches, rhinorrhea or sore throat. She has tried nothing for the symptoms. The treatment provided no relief.      Problems:  Patient Active Problem List   Diagnosis Date Noted   Pain in joint of right shoulder 12/14/2020   Sickle cell trait ([place]) 07/04/2019   Type 1 diabetes mellitus with hyperglycemia ([place]) 07/04/2019   Mild persistent asthma with acute exacerbation 07/04/2019    Allergies: No Known Allergies Medications:  Current Outpatient Medications:    neomycin-polymyxin-hydrocortisone (CORTISPORIN) OTIC solution, Place 3 drops into the right ear 4 (four) times daily. X 7 days, Disp: 10 mL, Rfl: 0   albuterol (VENTOLIN HFA) 108 (90 Base) MCG/ACT inhaler, Inhale 1 puff into the lungs every 6 (six) hours as needed for  shortness of breath., Disp: 18 g, Rfl: 0   blood glucose meter kit and supplies KIT, Dispense based on patient and insurance preference. Check glucose before breakfast. E11.29, Disp: 1 each, Rfl: 0   Fluticasone Propionate (FLONASE NA), Place into the nose., Disp: , Rfl:    Fluticasone-Salmeterol (ADVAIR DISKUS) 250-50 MCG/DOSE AEPB, Inhale 1 puff into the lungs 2 (two) times daily., Disp: 60 each, Rfl: 1    insulin glargine (LANTUS) 100 unit/mL SOPN, Inject 16 Units into the skin daily., Disp: 15 mL, Rfl: 0   insulin lispro (HUMALOG KWIKPEN) 100 UNIT/ML KwikPen, Inject 6 Units into the skin 3 (three) times daily., Disp: 15 mL, Rfl: 0   Insulin Pen Needle 32G X 4 MM MISC, 1 Device by Does not apply route as directed., Disp: 150 each, Rfl: 6   OneTouch Delica Lancets 67Y MISC, USE TO CHECK BLOOD GLUCOSE BEFORE BREAKFAST, Disp: , Rfl:    ONETOUCH ULTRA test strip, CHECK BLOOD GLUCOSE BEFORE BREAKFAST, Disp: 100 strip, Rfl: 1  Observations/Objective: Patient is well-developed, well-nourished in no acute distress.  Resting comfortably at home.  Head is normocephalic, atraumatic.  No labored breathing.  Speech is clear and coherent with logical content.  Patient is alert and oriented at baseline.    Assessment and Plan: 1. Acute swimmer's ear of right side - neomycin-polymyxin-hydrocortisone (CORTISPORIN) OTIC solution; Place 3 drops into the right ear 4 (four) times daily. X 7 days  Dispense: 10 mL; Refill: 0  - Otitis externa from swimming - Cortisporin drops provided - Warm compresses to external ear - Tylenol and Ibuprofen as needed - Avoid getting water in ear; ear plugs, even while showering - Seek in person evaluation if not improving or if symptoms worsen  Follow Up Instructions: I discussed the assessment and treatment plan with the patient. The patient was provided an opportunity to ask questions and all were answered. The patient agreed with the plan and demonstrated an understanding of the instructions.  A copy of instructions were sent to the patient via MyChart unless otherwise noted below.    The patient was advised to call back or seek an in-person evaluation if the symptoms worsen or if the condition fails to improve as anticipated.  Time:  I spent 9 minutes with the patient via telehealth technology discussing the above problems/concerns.    Mar Daring, PA-C

## 2022-05-12 NOTE — Patient Instructions (Signed)
[name], thank you for joining Mar Daring, PA-C for today's virtual visit.  While this provider is not your primary care provider (PCP), if your PCP is located in our provider database this encounter information will be shared with them immediately following your visit.  Consent: (Patient) Carrie Hudson provided verbal consent for this virtual visit at the beginning of the encounter.  Current Medications:  Current Outpatient Medications:    neomycin-polymyxin-hydrocortisone (CORTISPORIN) OTIC solution, Place 3 drops into the right ear 4 (four) times daily. X 7 days, Disp: 10 mL, Rfl: 0   albuterol (VENTOLIN HFA) 108 (90 Base) MCG/ACT inhaler, Inhale 1 puff into the lungs every 6 (six) hours as needed for shortness of breath., Disp: 18 g, Rfl: 0   blood glucose meter kit and supplies KIT, Dispense based on patient and insurance preference. Check glucose before breakfast. E11.29, Disp: 1 each, Rfl: 0   Fluticasone Propionate (FLONASE NA), Place into the nose., Disp: , Rfl:    Fluticasone-Salmeterol (ADVAIR DISKUS) 250-50 MCG/DOSE AEPB, Inhale 1 puff into the lungs 2 (two) times daily., Disp: 60 each, Rfl: 1   insulin glargine (LANTUS) 100 unit/mL SOPN, Inject 16 Units into the skin daily., Disp: 15 mL, Rfl: 0   insulin lispro (HUMALOG KWIKPEN) 100 UNIT/ML KwikPen, Inject 6 Units into the skin 3 (three) times daily., Disp: 15 mL, Rfl: 0   Insulin Pen Needle 32G X 4 MM MISC, 1 Device by Does not apply route as directed., Disp: 150 each, Rfl: 6   OneTouch Delica Lancets 94T MISC, USE TO CHECK BLOOD GLUCOSE BEFORE BREAKFAST, Disp: , Rfl:    ONETOUCH ULTRA test strip, CHECK BLOOD GLUCOSE BEFORE BREAKFAST, Disp: 100 strip, Rfl: 1   Medications ordered in this encounter:  Meds ordered this encounter  Medications   neomycin-polymyxin-hydrocortisone (CORTISPORIN) OTIC solution    Sig: Place 3 drops into the right ear 4 (four) times daily. X 7 days    Dispense:  10 mL    Refill:  0    Order  Specific Question:   Supervising Provider    Answer:   Sabra Heck, [place]     *If you need refills on other medications prior to your next appointment, please contact your pharmacy*  Follow-Up: Call back or seek an in-person evaluation if the symptoms worsen or if the condition fails to improve as anticipated.  Other Instructions Otitis Externa  Otitis externa is an infection of the outer ear canal. The outer ear canal is the area between the outside of the ear and the eardrum. Otitis externa is sometimes called swimmer's ear. What are the causes? Common causes of this condition include: Swimming in dirty water. Moisture in the ear. An injury to the inside of the ear. An object stuck in the ear. A cut or scrape on the outside of the ear or in the ear canal. What increases the risk? You are more likely to develop this condition if you go swimming often. What are the signs or symptoms? The first symptom of this condition is often itching in the ear. Later symptoms of the condition include: Swelling of the ear. Redness in the ear. Ear pain. The pain may get worse when you pull on your ear. Pus coming from the ear. How is this diagnosed? This condition may be diagnosed by examining the ear and testing fluid from the ear for bacteria and funguses. How is this treated? This condition may be treated with: Antibiotic ear drops. These are often  given for 10-14 days. Medicines to reduce itching and swelling. Follow these instructions at home: If you were prescribed antibiotic ear drops, use them as told by your health care provider. Do not stop using the antibiotic even if you start to feel better. Take over-the-counter and prescription medicines only as told by your health care provider. Avoid getting water in your ears as told by your health care provider. This may include avoiding swimming or water sports for a few days. Keep all follow-up visits. This is important. How is this  prevented? Keep your ears dry. Use the corner of a towel to dry your ears after you swim or bathe. Avoid scratching or putting things in your ear. Doing these things can damage the ear canal or remove the protective wax that lines it, which makes it easier for bacteria and funguses to grow. Avoid swimming in lakes, polluted water, or swimming pools that may not have enough chlorine. Contact a health care provider if: You have a fever. Your ear is still red, swollen, painful, or draining pus after 3 days. Your redness, swelling, or pain gets worse. You have a severe headache. Get help right away if: You have redness, swelling, and pain or tenderness in the area behind your ear. Summary Otitis externa is an infection of the outer ear canal. Common causes include swimming in dirty water, moisture in the ear, or a cut or scrape in the ear. Symptoms include pain, redness, and swelling of the ear canal. If you were prescribed antibiotic ear drops, use them as told by your health care provider. Do not stop using the antibiotic even if you start to feel better. This information is not intended to replace advice given to you by your health care provider. Make sure you discuss any questions you have with your health care provider. Document Revised: 12/01/2020 Document Reviewed: 12/01/2020 Elsevier Patient Education  [employer].    If you have been instructed to have an in-person evaluation today at a local Urgent Care facility, please use the link below. It will take you to a list of all of our available [facility], including address, phone number and hours of operation. Please do not delay care.  [facility]  If you or a family member do not have a primary care provider, use the link below to schedule a visit and establish care. When you choose a [facility] primary care physician or advanced practice provider, you gain a long-term partner in health. Find a Primary  Care Provider  Learn more about [facility]'s in-office and virtual care options: [facility]

## 2022-05-22 NOTE — Progress Notes (Signed)
Patient ID: Carrie Hudson, female    DOB: 1996/05/27  MRN: [id number]  CC: Annual Physical Exam  Subjective: Carrie Hudson is a 26 y.o. female who presents for annual physical exam.   Her concerns today include:  - Reports not taking insulin regimen as prescribed but she is trying. She is not checking blood sugars at home but does have the means to do so. She did not receive call from Endocrinology referral. - Right ear discomfort persisting. Sounds muffled. She completed the antibiotic solution prescribed during telemedicine visit 05/12/2022. However, she doesn't feel that the medication went into her ear as she noticed the medication would run out of her ear even after waiting several minutes. States in the past when she had swimmers ear a device was used to assist with getting the ear solution into her ear.   - Breakthrough bleeding since beginning Depo. Usually has to wear a panty liner or sanitary napkin. Thinking of changing to a new birth control method in the future but will give it a few more months to see if bleeding lessens. States Depo did help with making periods less painful.  - Needs refills on Albuterol inhaler. No issues or concerns.   Patient Active Problem List   Diagnosis Date Noted   Pain in joint of right shoulder 12/14/2020   Sickle cell trait ([place]) 07/04/2019   Type 1 diabetes mellitus with hyperglycemia ([place]) 07/04/2019   Mild persistent asthma with acute exacerbation 07/04/2019     Current Outpatient Medications on File Prior to Visit  Medication Sig Dispense Refill   blood glucose meter kit and supplies KIT Dispense based on patient and insurance preference. Check glucose before breakfast. E11.29 1 each 0   Fluticasone Propionate (FLONASE NA) Place into the nose.     Fluticasone-Salmeterol (ADVAIR DISKUS) 250-50 MCG/DOSE AEPB Inhale 1 puff into the lungs 2 (two) times daily. 60 each 1   Insulin Pen Needle 32G X 4 MM MISC 1 Device by Does not apply route as directed.  150 each 6   neomycin-polymyxin-hydrocortisone (CORTISPORIN) OTIC solution Place 3 drops into the right ear 4 (four) times daily. X 7 days 10 mL 0   OneTouch Delica Lancets 59D MISC USE TO CHECK BLOOD GLUCOSE BEFORE BREAKFAST     ONETOUCH ULTRA test strip CHECK BLOOD GLUCOSE BEFORE BREAKFAST 100 strip 1   [DISCONTINUED] insulin aspart (NOVOLOG FLEXPEN) 100 UNIT/ML FlexPen Inject 6 Units into the skin 3 (three) times daily with meals. 15 mL 11   No current facility-administered medications on file prior to visit.    No Known Allergies  Social History   Socioeconomic History   Marital status: Single    Spouse name: Not on file   Number of children: Not on file   Years of education: Not on file   Highest education level: Not on file  Occupational History   Not on file  Tobacco Use   Smoking status: Never    Passive exposure: Never   Smokeless tobacco: Never  Vaping Use   Vaping Use: Never used  Substance and Sexual Activity   Alcohol use: Yes    Alcohol/week: 1.0 standard drink of alcohol    Types: 1 Shots of liquor per week    Comment: once every 2 wks   Drug use: Never   Sexual activity: Yes  Other Topics Concern   Not on file  Social History Narrative   Not on file   Social Determinants of Health  Financial Resource Strain: Not on file  Food Insecurity: Not on file  Transportation Needs: Not on file  Physical Activity: Not on file  Stress: Not on file  Social Connections: Not on file  Intimate Partner Violence: Not on file    Family History  Problem Relation Age of Onset   Diabetes Father    Diabetes Paternal Grandfather    Sickle cell anemia Brother     Past Surgical History:  Procedure Laterality Date   TONSILLECTOMY      ROS: Review of Systems Negative except as stated above  PHYSICAL EXAM: BP 123/74 (BP Location: Left Arm, Patient Position: Sitting, Cuff Size: Normal)   Pulse 80   Temp 98.3 F (36.8 C)   Resp 16   Ht 5' 8.11" (1.73 m)   Wt  163 lb (73.9 kg)   SpO2 94%   BMI 24.70 kg/m   Physical Exam HENT:     Head: Normocephalic and atraumatic.     Right Ear: Tympanic membrane, ear canal and external ear normal.     Left Ear: Tympanic membrane, ear canal and external ear normal.     Nose: Nose normal.     Mouth/Throat:     Mouth: Mucous membranes are moist.     Pharynx: Oropharynx is clear.  Eyes:     Extraocular Movements: Extraocular movements intact.     Conjunctiva/sclera: Conjunctivae normal.     Pupils: Pupils are equal, round, and reactive to light.  Cardiovascular:     Rate and Rhythm: Normal rate and regular rhythm.     Pulses: Normal pulses.     Heart sounds: Normal heart sounds.  Pulmonary:     Effort: Pulmonary effort is normal.     Breath sounds: Normal breath sounds.  Chest:     Comments: Patient declined.  Abdominal:     General: Bowel sounds are normal.     Palpations: Abdomen is soft.  Genitourinary:    General: Normal vulva.     Vagina: Normal.     Cervix: Normal.     Uterus: Normal.      Adnexa: Right adnexa normal and left adnexa normal.     Comments: Elmon Else, CMA present.  Musculoskeletal:        General: Normal range of motion.     Right shoulder: Normal.     Left shoulder: Normal.     Right upper arm: Normal.     Left upper arm: Normal.     Right elbow: Normal.     Left elbow: Normal.     Right forearm: Normal.     Left forearm: Normal.     Right wrist: Normal.     Left wrist: Normal.     Right hand: Normal.     Left hand: Normal.     Cervical back: Normal, normal range of motion and neck supple.     Thoracic back: Normal.     Lumbar back: Normal.     Right hip: Normal.     Left hip: Normal.     Right upper leg: Normal.     Left upper leg: Normal.     Right knee: Normal.     Left knee: Normal.     Right lower leg: Normal.     Left lower leg: Normal.     Right ankle: Normal.     Left ankle: Normal.     Right foot: Normal.     Left foot: Normal.  Skin:  General: Skin is warm and dry.     Capillary Refill: Capillary refill takes less than 2 seconds.  Neurological:     General: No focal deficit present.     Mental Status: She is alert and oriented to person, place, and time.  Psychiatric:        Mood and Affect: Mood normal.        Behavior: Behavior normal.    Results for orders placed or performed in visit on 05/31/22  POCT glycosylated hemoglobin (Hb A1C)  Result Value Ref Range   Hemoglobin A1C 14.2 (A) 4.0 - 5.6 %   HbA1c POC (<> result, manual entry)     HbA1c, POC (prediabetic range)     HbA1c, POC (controlled diabetic range)       ASSESSMENT AND PLAN: 1. Annual physical exam - Counseled on 150 minutes of exercise per week, healthy eating (including decreased daily intake of saturated fats, cholesterol, added sugars, sodium), STI prevention, routine healthcare maintenance.  2. Screening for metabolic disorder - XIP38+SNKN to check kidney function, liver function, and electrolyte balance.  - CMP14+EGFR  3. Screening for deficiency anemia - CBC to screen for anemia. - CBC  4. Screening cholesterol level - Lipid panel to screen for high cholesterol.  - Lipid panel  5. Thyroid disorder screen - TSH to check thyroid function.  - TSH  6. Need for hepatitis C screening test - Hepatitis C antibody to screen for hepatitis C.  - Hepatitis C Antibody  7. Encounter for screening for HIV - HIV antibody to screen for human immunodeficiency virus.  - HIV antibody (with reflex)  8. Pap smear for cervical cancer screening - Cytology - PAP for cervical cancer screening.  - Cytology - PAP([place])  9. Routine screening for STI (sexually transmitted infection) - Cervicovaginal self-swab to screen for chlamydia, gonorrhea, trichomonas, bacterial vaginitis, and candida vaginitis. - Cervicovaginal ancillary only  10. Uncontrolled type 1 diabetes mellitus with hyperglycemia (HCC) - Hemoglobin A1c not at goal 14.2%, goal <  7%. This is increased from previous 12.8%. - Increase Insulin Glargine from 16 units daily to 20 units daily x 7 days. Then increase to 23 units continuing. - Discussed with patient the importance of checking blood sugars at home. - Discussed the importance of healthy eating habits, low-carbohydrate diet, low-sugar diet, regular aerobic exercise (at least 150 minutes a week as tolerated) and medication compliance to achieve or maintain control of diabetes. - Follow-up with clinical pharmacist Acquanetta Belling Ausdall in 2 weeks or sooner if needed. Medications may be revised at that time if needed. - New referral to Endocrinology ordered. Also, message sent to referral coordinator Maren Reamer to see if order can be expedited.  - Follow-up with primary provider as scheduled. - Ambulatory referral to Endocrinology - POCT glycosylated hemoglobin (Hb A1C) - insulin lispro (HUMALOG KWIKPEN) 100 UNIT/ML KwikPen; Inject 6 Units into the skin 3 (three) times daily.  Dispense: 15 mL; Refill: 1 - insulin glargine (LANTUS) 100 unit/mL SOPN; Inject 20 Units into the skin daily for 7 days, THEN 23 Units daily.  Dispense: 15 mL; Refill: 0 - Ambulatory referral to Endocrinology  11. Mild persistent asthma with acute exacerbation - Continue Albuterol as prescribed.  - Follow-up with primary provider as scheduled. - albuterol (VENTOLIN HFA) 108 (90 Base) MCG/ACT inhaler; Inhale 1 puff into the lungs every 6 (six) hours as needed for shortness of breath.  Dispense: 18 g; Refill: 1  12. Dysmenorrhea 13. Encounter for management and  injection of depo-Provera 14. Encounter for surveillance of injectable contraceptive - Referral to Gynecology for further evaluation and management.  - Ambulatory referral to Gynecology  15. Acute swimmer's ear of right side - Referral to ENT for further evaluation and management.  - Ambulatory referral to ENT   Patient was given the opportunity to ask questions.  Patient verbalized  understanding of the plan and was able to repeat key elements of the plan. Patient was given clear instructions to go to Emergency Department or return to medical center if symptoms don't improve, worsen, or new problems develop.The patient verbalized understanding.   Orders Placed This Encounter  Procedures   HIV antibody (with reflex)   Hepatitis C Antibody   CBC   Lipid panel   TSH   CMP14+EGFR   Ambulatory referral to Endocrinology   Ambulatory referral to ENT   Ambulatory referral to Gynecology   Ambulatory referral to Endocrinology   POCT glycosylated hemoglobin (Hb A1C)     Requested Prescriptions   Signed Prescriptions Disp Refills   albuterol (VENTOLIN HFA) 108 (90 Base) MCG/ACT inhaler 18 g 1    Sig: Inhale 1 puff into the lungs every 6 (six) hours as needed for shortness of breath.   insulin lispro (HUMALOG KWIKPEN) 100 UNIT/ML KwikPen 15 mL 1    Sig: Inject 6 Units into the skin 3 (three) times daily.   insulin glargine (LANTUS) 100 unit/mL SOPN 15 mL 0    Sig: Inject 20 Units into the skin daily for 7 days, THEN 23 Units daily.    Return in about 1 year (around 06/01/2023) for Physical per patient preference, Follow-Up or next available 2 weeks Acquanetta Belling Ausdall RPH-CPP.  Camillia Herter, NP

## 2022-05-31 ENCOUNTER — Encounter: Payer: Self-pay | Admitting: Family

## 2022-05-31 ENCOUNTER — Other Ambulatory Visit (HOSPITAL_COMMUNITY)
Admission: RE | Admit: 2022-05-31 | Discharge: 2022-05-31 | Disposition: A | Discharge status: Home / Self Care | Payer: No Typology Code available for payment source | Source: Ambulatory Visit | Attending: Family | Admitting: Family

## 2022-05-31 ENCOUNTER — Ambulatory Visit (INDEPENDENT_AMBULATORY_CARE_PROVIDER_SITE_OTHER): Payer: No Typology Code available for payment source | Admitting: Family

## 2022-05-31 ENCOUNTER — Telehealth: Payer: Self-pay | Admitting: Family

## 2022-05-31 VITALS — BP 123/74 | HR 80 | Temp 98.3°F | Resp 16 | Ht 68.11 in | Wt 163.0 lb

## 2022-05-31 DIAGNOSIS — Z113 Encounter for screening for infections with a predominantly sexual mode of transmission: Secondary | ICD-10-CM

## 2022-05-31 DIAGNOSIS — Z1159 Encounter for screening for other viral diseases: Secondary | ICD-10-CM

## 2022-05-31 DIAGNOSIS — H60331 Swimmer's ear, right ear: Secondary | ICD-10-CM

## 2022-05-31 DIAGNOSIS — Z124 Encounter for screening for malignant neoplasm of cervix: Secondary | ICD-10-CM | POA: Diagnosis not present

## 2022-05-31 DIAGNOSIS — Z0000 Encounter for general adult medical examination without abnormal findings: Secondary | ICD-10-CM

## 2022-05-31 DIAGNOSIS — E1065 Type 1 diabetes mellitus with hyperglycemia: Secondary | ICD-10-CM

## 2022-05-31 DIAGNOSIS — Z30013 Encounter for initial prescription of injectable contraceptive: Secondary | ICD-10-CM

## 2022-05-31 DIAGNOSIS — Z13220 Encounter for screening for lipoid disorders: Secondary | ICD-10-CM

## 2022-05-31 DIAGNOSIS — N946 Dysmenorrhea, unspecified: Secondary | ICD-10-CM

## 2022-05-31 DIAGNOSIS — Z114 Encounter for screening for human immunodeficiency virus [HIV]: Secondary | ICD-10-CM

## 2022-05-31 DIAGNOSIS — J4531 Mild persistent asthma with (acute) exacerbation: Secondary | ICD-10-CM

## 2022-05-31 DIAGNOSIS — Z3042 Encounter for surveillance of injectable contraceptive: Secondary | ICD-10-CM | POA: Diagnosis not present

## 2022-05-31 DIAGNOSIS — Z130 Encounter for screening for diseases of the blood and blood-forming organs and certain disorders involving the immune mechanism: Secondary | ICD-10-CM

## 2022-05-31 DIAGNOSIS — Z13228 Encounter for screening for other metabolic disorders: Secondary | ICD-10-CM

## 2022-05-31 DIAGNOSIS — Z1329 Encounter for screening for other suspected endocrine disorder: Secondary | ICD-10-CM

## 2022-05-31 LAB — POCT GLYCOSYLATED HEMOGLOBIN (HGB A1C): Hemoglobin A1C: 14.2 % — AB (ref 4.0–5.6)

## 2022-05-31 MED ORDER — INSULIN GLARGINE 100 UNITS/ML SOLOSTAR PEN: PEN_INJECTOR | SUBCUTANEOUS | 0 refills | Status: DC

## 2022-05-31 MED ORDER — ALBUTEROL SULFATE HFA 108 (90 BASE) MCG/ACT IN AERS: 1.0000 | INHALATION_SPRAY | Freq: Four times a day (QID) | RESPIRATORY_TRACT | 1 refills | Status: AC | PRN

## 2022-05-31 MED ORDER — MEDROXYPROGESTERONE ACETATE 150 MG/ML IM SUSP
150.0000 mg | Freq: Once | INTRAMUSCULAR | Status: AC
  Administered 2022-05-31: 150 mg via INTRAMUSCULAR

## 2022-05-31 MED ORDER — INSULIN LISPRO (1 UNIT DIAL) 100 UNIT/ML (KWIKPEN): 6.0000 [IU] | PEN_INJECTOR | Freq: Three times a day (TID) | SUBCUTANEOUS | 1 refills | Status: DC

## 2022-05-31 NOTE — Patient Instructions (Signed)

## 2022-05-31 NOTE — Addendum Note (Signed)
Addended by: Margorie John on: 05/31/2022 05:13 PM   Modules accepted: Orders

## 2022-05-31 NOTE — Progress Notes (Signed)
.  Pt presents for annual physical exam   -pt feels she has ear infection -needs refill on inhaler and Humalog

## 2022-06-01 ENCOUNTER — Other Ambulatory Visit: Payer: Self-pay | Admitting: Family

## 2022-06-01 ENCOUNTER — Telehealth: Payer: Self-pay | Admitting: Family

## 2022-06-01 DIAGNOSIS — A549 Gonococcal infection, unspecified: Secondary | ICD-10-CM

## 2022-06-01 DIAGNOSIS — A5901 Trichomonal vulvovaginitis: Secondary | ICD-10-CM | POA: Insufficient documentation

## 2022-06-01 DIAGNOSIS — E785 Hyperlipidemia, unspecified: Secondary | ICD-10-CM

## 2022-06-01 DIAGNOSIS — B9689 Other specified bacterial agents as the cause of diseases classified elsewhere: Secondary | ICD-10-CM | POA: Insufficient documentation

## 2022-06-01 DIAGNOSIS — A749 Chlamydial infection, unspecified: Secondary | ICD-10-CM | POA: Insufficient documentation

## 2022-06-01 DIAGNOSIS — B3731 Acute candidiasis of vulva and vagina: Secondary | ICD-10-CM | POA: Insufficient documentation

## 2022-06-01 LAB — CERVICOVAGINAL ANCILLARY ONLY
Bacterial Vaginitis (gardnerella): POSITIVE — AB
Candida Glabrata: NEGATIVE
Candida Vaginitis: POSITIVE — AB
Chlamydia: POSITIVE — AB
Comment: NEGATIVE
Comment: NEGATIVE
Comment: NEGATIVE
Comment: NEGATIVE
Comment: NEGATIVE
Comment: NORMAL
Neisseria Gonorrhea: POSITIVE — AB
Trichomonas: POSITIVE — AB

## 2022-06-01 MED ORDER — FLUCONAZOLE 150 MG PO TABS
150.0000 mg | ORAL_TABLET | Freq: Once | ORAL | 0 refills | Status: AC
Start: 2022-06-01 — End: 2022-06-01

## 2022-06-01 MED ORDER — DOXYCYCLINE HYCLATE 100 MG PO TABS: 100.0000 mg | ORAL_TABLET | Freq: Two times a day (BID) | ORAL | 0 refills | Status: AC

## 2022-06-01 MED ORDER — ATORVASTATIN CALCIUM 20 MG PO TABS: 20.0000 mg | ORAL_TABLET | Freq: Every day | ORAL | 2 refills | Status: DC

## 2022-06-01 MED ORDER — METRONIDAZOLE 500 MG PO TABS
500.0000 mg | ORAL_TABLET | Freq: Two times a day (BID) | ORAL | 0 refills | Status: AC
Start: 2022-06-01 — End: 2022-06-08

## 2022-06-01 MED ORDER — CEFTRIAXONE SODIUM 500 MG IJ SOLR
500.0000 mg | Freq: Once | INTRAMUSCULAR | Status: AC
  Administered 2022-06-07: 500 mg via INTRAMUSCULAR

## 2022-06-02 ENCOUNTER — Other Ambulatory Visit: Payer: Self-pay

## 2022-06-02 ENCOUNTER — Telehealth: Payer: Self-pay | Admitting: *Deleted

## 2022-06-02 ENCOUNTER — Ambulatory Visit: Payer: No Typology Code available for payment source

## 2022-06-02 ENCOUNTER — Ambulatory Visit: Payer: No Typology Code available for payment source | Attending: Family | Admitting: Pharmacist

## 2022-06-02 ENCOUNTER — Encounter: Payer: Self-pay | Admitting: Pharmacist

## 2022-06-02 ENCOUNTER — Encounter: Payer: Self-pay | Admitting: Family

## 2022-06-02 DIAGNOSIS — E1065 Type 1 diabetes mellitus with hyperglycemia: Secondary | ICD-10-CM

## 2022-06-02 LAB — LIPID PANEL
Chol/HDL Ratio: 3.7 ratio (ref 0.0–4.4)
Cholesterol, Total: 206 mg/dL — ABNORMAL HIGH (ref 100–199)
HDL: 56 mg/dL (ref >39)
LDL Chol Calc (NIH): 129 mg/dL — ABNORMAL HIGH (ref 0–99)
Triglycerides: 119 mg/dL (ref 0–149)
VLDL Cholesterol Cal: 21 mg/dL (ref 5–40)

## 2022-06-02 LAB — CBC
Hematocrit: 40.5 % (ref 34.0–46.6)
Hemoglobin: 12.5 g/dL (ref 11.1–15.9)
MCH: 25.1 pg — ABNORMAL LOW (ref 26.6–33.0)
MCHC: 30.9 g/dL — ABNORMAL LOW (ref 31.5–35.7)
MCV: 81 fL (ref 79–97)
Platelets: 219 10*3/uL (ref 150–450)
RBC: 4.99 x10E6/uL (ref 3.77–5.28)
RDW: 13.9 % (ref 11.7–15.4)
WBC: 6.4 10*3/uL (ref 3.4–10.8)

## 2022-06-02 LAB — CMP14+EGFR
ALT: 9 IU/L (ref 0–32)
AST: 11 IU/L (ref 0–40)
Albumin/Globulin Ratio: 1.7 (ref 1.2–2.2)
Albumin: 4.6 g/dL (ref 4.0–5.0)
Alkaline Phosphatase: 163 IU/L — ABNORMAL HIGH (ref 44–121)
BUN/Creatinine Ratio: 14 (ref 9–23)
BUN: 17 mg/dL (ref 6–20)
Bilirubin Total: 0.3 mg/dL (ref 0.0–1.2)
CO2: 22 mmol/L (ref 20–29)
Calcium: 10 mg/dL (ref 8.7–10.2)
Chloride: 93 mmol/L — ABNORMAL LOW (ref 96–106)
Creatinine, Ser: 1.25 mg/dL — ABNORMAL HIGH (ref 0.57–1.00)
Globulin, Total: 2.7 g/dL (ref 1.5–4.5)
Glucose: 722 mg/dL (ref 70–99)
Potassium: 5 mmol/L (ref 3.5–5.2)
Sodium: 132 mmol/L — ABNORMAL LOW (ref 134–144)
Total Protein: 7.3 g/dL (ref 6.0–8.5)
eGFR: 61 mL/min/{1.73_m2} (ref >59)

## 2022-06-02 LAB — HIV ANTIBODY (ROUTINE TESTING W REFLEX): HIV Screen 4th Generation wRfx: NONREACTIVE

## 2022-06-02 LAB — HEPATITIS C ANTIBODY: Hep C Virus Ab: NONREACTIVE

## 2022-06-02 LAB — TSH: TSH: 0.923 u[IU]/mL (ref 0.450–4.500)

## 2022-06-02 MED ORDER — FREESTYLE LIBRE 2 READER DEVI
0 refills | Status: DC
  Filled 2022-06-02: qty 1, 30d supply, fill #0

## 2022-06-02 MED ORDER — FREESTYLE LIBRE 2 SENSOR MISC
3 refills | Status: DC
  Filled 2022-06-02: qty 2, 28d supply, fill #0

## 2022-06-02 MED ORDER — INSULIN GLARGINE 100 UNITS/ML SOLOSTAR PEN
24.0000 [IU] | PEN_INJECTOR | Freq: Every day | SUBCUTANEOUS | 2 refills | Status: DC
  Filled 2022-06-02: qty 9, 37d supply, fill #0

## 2022-06-02 MED ORDER — TRESIBA FLEXTOUCH 100 UNIT/ML ~~LOC~~ SOPN
24.0000 [IU] | PEN_INJECTOR | Freq: Every day | SUBCUTANEOUS | 1 refills | Status: DC
  Filled 2022-06-02: qty 9, 37d supply, fill #0

## 2022-06-02 MED ORDER — INSULIN LISPRO (1 UNIT DIAL) 100 UNIT/ML (KWIKPEN)
8.0000 [IU] | PEN_INJECTOR | Freq: Three times a day (TID) | SUBCUTANEOUS | 1 refills | Status: DC
  Filled 2022-06-02: qty 9, 38d supply, fill #0

## 2022-06-02 NOTE — Telephone Encounter (Signed)
Carrie Hudson from [facility] called with critical result. Glucose was  722 on 05/31/22. Note from [name] stating pt aware and has been advised to go to ED. I do not see any ED or UC visits.

## 2022-06-02 NOTE — Progress Notes (Signed)
    S:     No chief complaint on file.  Carrie Hudson is a 26 y.o. female who presents for diabetes evaluation, education, and management.  PMH is significant for T1DM, asthma, HLD.  Patient was referred and last seen by Primary Care Provider, Carrie Hudson, on 05/31/2022. At that visit, pt admitted to non-adherence and her CBG was found to be >500. She was advised to go to the ED.   Today, patient arrives in good spirits and presents without any assistance.  Patient reports Diabetes was diagnosed 2021.   Family/Social History:  Fhx: DM Tobacco: never smoker  Alcohol: a couple of mixed drinks on the weekend   Current diabetes medications include: Lantus 20u daily, Humalog 6u TID  Patient admits to missing a couple of doses of Humalog even in the couple of days since seeing Carrie Hudson.   Insurance coverage: [employer] Employee insurance   Patient denies hypoglycemic events.  Reported home fasting blood sugars: has not been checking consistently   Reported 2 hour post-meal/random blood sugars: has not been checking consistently.  Patient denies nocturia (nighttime urination).  Patient denies neuropathy (nerve pain). Patient denies visual changes. Patient reports self foot exams.   Patient reported dietary habits:  -Tries to adhere to a diabetic diet but admits that she does not do what she needs to 100% of the time.   Patient-reported exercise habits: none outside of work   O:   ROS  Physical Exam  7 day average blood glucose: no GM with her today.  No CGM in place.    Lab Results  Component Value Date   HGBA1C 14.2 (A) 05/31/2022   There were no vitals filed for this visit.  Lipid Panel     Component Value Date/Time   CHOL 206 (H) 05/31/2022 1125   TRIG 119 05/31/2022 1125   HDL 56 05/31/2022 1125   CHOLHDL 3.7 05/31/2022 1125   LDLCALC 129 (H) 05/31/2022 1125    Clinical Atherosclerotic Cardiovascular Disease (ASCVD): No  The ASCVD Risk score (Arnett DK, et  al., 2019) failed to calculate for the following reasons:   The 2019 ASCVD risk score is only valid for ages 63 to 32   A/P: Diabetes longstanding currently uncontrolled. Patient is able to verbalize appropriate hypoglycemia management plan. Medication adherence appears suboptimal. - Change  Lantus to [place] per insurance. Will increase to 24 units daily.  -Increase Humalog to 8u TID.   Josephine Igo supplies sent.  -Extensively discussed pathophysiology of diabetes, recommended lifestyle interventions, dietary effects on blood sugar control.  -Counseled on s/sx of and management of hypoglycemia.  -Next A1c anticipated 08/2022.   Written patient instructions provided. Patient verbalized understanding of treatment plan.  Total time in face to face counseling 30 minutes.    Follow-up:  Pharmacist in 1 month.  Carrie Hudson, PharmD, Carrie Hudson, CPP Clinical Pharmacist [facility] [place] [phone]

## 2022-06-07 ENCOUNTER — Other Ambulatory Visit: Payer: Self-pay | Admitting: Family

## 2022-06-07 ENCOUNTER — Ambulatory Visit (INDEPENDENT_AMBULATORY_CARE_PROVIDER_SITE_OTHER): Payer: No Typology Code available for payment source

## 2022-06-07 ENCOUNTER — Encounter: Payer: Self-pay | Admitting: Family

## 2022-06-07 DIAGNOSIS — A549 Gonococcal infection, unspecified: Secondary | ICD-10-CM

## 2022-06-07 DIAGNOSIS — R87612 Low grade squamous intraepithelial lesion on cytologic smear of cervix (LGSIL): Secondary | ICD-10-CM

## 2022-06-07 LAB — CYTOLOGY - PAP

## 2022-06-07 NOTE — Progress Notes (Signed)
Ceftriaxone administered in left ventrogluteal, pt tolerated injected well

## 2022-06-09 ENCOUNTER — Other Ambulatory Visit: Payer: Self-pay

## 2022-06-13 NOTE — Telephone Encounter (Signed)
Hi. For confirmation is patient scheduled with Technical sales engineer or [facility]? Both are listed in your reply. Thank you.

## 2022-07-05 ENCOUNTER — Other Ambulatory Visit: Payer: No Typology Code available for payment source

## 2022-07-05 DIAGNOSIS — E785 Hyperlipidemia, unspecified: Secondary | ICD-10-CM

## 2022-07-06 LAB — LIPID PANEL
Chol/HDL Ratio: 4.2 ratio (ref 0.0–4.4)
Cholesterol, Total: 201 mg/dL — ABNORMAL HIGH (ref 100–199)
HDL: 48 mg/dL (ref >39)
LDL Chol Calc (NIH): 141 mg/dL — ABNORMAL HIGH (ref 0–99)
Triglycerides: 68 mg/dL (ref 0–149)
VLDL Cholesterol Cal: 12 mg/dL (ref 5–40)

## 2022-07-10 ENCOUNTER — Ambulatory Visit: Payer: No Typology Code available for payment source | Admitting: Pharmacist

## 2022-07-17 ENCOUNTER — Other Ambulatory Visit: Payer: Self-pay

## 2022-07-17 MED ORDER — DEXCOM G7 SENSOR MISC
1.0000 | 11 refills | Status: DC
Start: 2022-07-17 — End: 2023-11-30
  Filled 2022-07-17: qty 3, 30d supply, fill #0

## 2022-07-24 ENCOUNTER — Other Ambulatory Visit: Payer: Self-pay

## 2022-10-13 NOTE — Progress Notes (Deleted)
Patient ID: Carrie Hudson, female    DOB: [birth year]  MRN: [id number]  CC: No chief complaint on file.   Subjective: Carrie Hudson is a 27 y.o. female who presents for  Her concerns today include:    Sent Referral to [facility]   Ph# [phone] Fax [phone] address [address] [employer], [place].     08/22/2022 [facility]  Hope this letter finds you well.   Our office has received a referral appointment request on your behalf that we would like to get scheduled for you.  We have attempted to contact you to schedule an appointment; however, have not been able to reach you.   All of [place] at [facility] are committed to the continuation of your care and would like to hear back from you if you choose to come to [place] for your health care needs.  If we do not hear from you no later than 5 days from the date of this letter, we will assume that you have elected to go to another office. We will close this referral request and update the referring physician's office.   We hope to hear from you soon.   Sincerely,   05/31/2022 DM  - Hemoglobin A1c not at goal 14.2%, goal < 7%. This is increased from previous 12.8%. - Increase Insulin Glargine from 16 units daily to 20 units daily x 7 days. Then increase to 23 units continuing. - Discussed with patient the importance of checking blood sugars at home. - Follow-up with clinical pharmacist Acquanetta Belling Ausdall in 2 weeks or sooner if needed. Medications may be revised at that time if needed.  - New referral to Endocrinology ordered. Also, message sent to referral coordinator Maren Reamer to see if order can be expedited.   HLD  - Atorvastatin  Patient Active Problem List   Diagnosis Date Noted   Hyperlipidemia 06/01/2022   Gonorrhea 06/01/2022   Chlamydia 06/01/2022   Trichomonas vaginitis 06/01/2022   BV (bacterial vaginosis)  06/01/2022   Candida vaginitis 06/01/2022   Pain in joint of right shoulder 12/14/2020   Sickle cell trait ([place]) 07/04/2019   Type 1 diabetes mellitus with hyperglycemia ([place]) 07/04/2019   Mild persistent asthma with acute exacerbation 07/04/2019     Current Outpatient Medications on File Prior to Visit  Medication Sig Dispense Refill   albuterol (VENTOLIN HFA) 108 (90 Base) MCG/ACT inhaler Inhale 1 puff into the lungs every 6 (six) hours as needed for shortness of breath. 18 g 1   atorvastatin (LIPITOR) 20 MG tablet Take 1 tablet (20 mg total) by mouth daily. 30 tablet 2   blood glucose meter kit and supplies KIT Dispense based on patient and insurance preference. Check glucose before breakfast. E11.29 1 each 0   Continuous Blood Gluc Receiver (FREESTYLE LIBRE 2 READER) DEVI Use to check blood sugar TID. E10.65 1 each 0   Continuous Blood Gluc Sensor (DEXCOM G7 SENSOR) MISC Change sensor every 10 days. 3 each 11   Continuous Blood Gluc Sensor (FREESTYLE LIBRE 2 SENSOR) MISC Use to check blood sugar TID. Change sensors once every 2 weeks. E10.65 2 each 3   Fluticasone Propionate (FLONASE NA) Place into the nose.     Fluticasone-Salmeterol (ADVAIR DISKUS) 250-50 MCG/DOSE AEPB Inhale 1 puff into the lungs 2 (two) times daily. 60 each 1   insulin degludec (TRESIBA FLEXTOUCH) 100 UNIT/ML FlexTouch Pen Inject 24  Units into the skin daily. 15 mL 1   insulin lispro (HUMALOG KWIKPEN) 100 UNIT/ML KwikPen Inject 8 Units into the skin 3 (three) times daily. 15 mL 1   Insulin Pen Needle 32G X 4 MM MISC 1 Device by Does not apply route as directed. 150 each 6   neomycin-polymyxin-hydrocortisone (CORTISPORIN) OTIC solution Place 3 drops into the right ear 4 (four) times daily. X 7 days 10 mL 0   OneTouch Delica Lancets 99991111 MISC USE TO CHECK BLOOD GLUCOSE BEFORE BREAKFAST     ONETOUCH ULTRA test strip CHECK BLOOD GLUCOSE BEFORE BREAKFAST 100 strip 1   [DISCONTINUED] insulin aspart (NOVOLOG FLEXPEN) 100  UNIT/ML FlexPen Inject 6 Units into the skin 3 (three) times daily with meals. 15 mL 11   No current facility-administered medications on file prior to visit.    No Known Allergies  Social History   Socioeconomic History   Marital status: Single    Spouse name: Not on file   Number of children: Not on file   Years of education: Not on file   Highest education level: Not on file  Occupational History   Not on file  Tobacco Use   Smoking status: Never    Passive exposure: Never   Smokeless tobacco: Never  Vaping Use   Vaping Use: Never used  Substance and Sexual Activity   Alcohol use: Yes    Alcohol/week: 1.0 standard drink of alcohol    Types: 1 Shots of liquor per week    Comment: once every 2 wks   Drug use: Never   Sexual activity: Yes  Other Topics Concern   Not on file  Social History Narrative   Not on file   Social Determinants of Health   Financial Resource Strain: Not on file  Food Insecurity: Not on file  Transportation Needs: Not on file  Physical Activity: Not on file  Stress: Not on file  Social Connections: Not on file  Intimate Partner Violence: Not on file    Family History  Problem Relation Age of Onset   Diabetes Father    Diabetes Paternal Grandfather    Sickle cell anemia Brother     Past Surgical History:  Procedure Laterality Date   TONSILLECTOMY      ROS: Review of Systems Negative except as stated above  PHYSICAL EXAM: There were no vitals taken for this visit.  Physical Exam  {female adult master:310786} {female adult master:310785}     Latest Ref Rng & Units 05/31/2022   11:25 AM 10/14/2019   12:23 PM 06/19/2019   12:00 AM  CMP  Glucose 70 - 99 mg/dL 722  316    BUN 6 - 20 mg/dL 17  9    Creatinine 0.57 - 1.00 mg/dL 1.25  1.09  1.0      Sodium 134 - 144 mmol/L 132  134  134      Potassium 3.5 - 5.2 mmol/L 5.0  4.2  4.3      Chloride 96 - 106 mmol/L 93  100    CO2 20 - 29 mmol/L 22  19    Calcium 8.7 - 10.2 mg/dL  10.0  9.5    Total Protein 6.0 - 8.5 g/dL 7.3     Total Bilirubin 0.0 - 1.2 mg/dL 0.3     Alkaline Phos 44 - 121 IU/L 163     AST 0 - 40 IU/L 11     ALT 0 - 32 IU/L 9  This result is from an external source.   Lipid Panel     Component Value Date/Time   CHOL 201 (H) 07/05/2022 1008   TRIG 68 07/05/2022 1008   HDL 48 07/05/2022 1008   CHOLHDL 4.2 07/05/2022 1008   LDLCALC 141 (H) 07/05/2022 1008    CBC    Component Value Date/Time   WBC 6.4 05/31/2022 1125   RBC 4.99 05/31/2022 1125   HGB 12.5 05/31/2022 1125   HCT 40.5 05/31/2022 1125   PLT 219 05/31/2022 1125   MCV 81 05/31/2022 1125   MCH 25.1 (L) 05/31/2022 1125   MCHC 30.9 (L) 05/31/2022 1125   RDW 13.9 05/31/2022 1125    ASSESSMENT AND PLAN:  There are no diagnoses linked to this encounter.   Patient was given the opportunity to ask questions.  Patient verbalized understanding of the plan and was able to repeat key elements of the plan. Patient was given clear instructions to go to Emergency Department or return to medical center if symptoms don't improve, worsen, or new problems develop.The patient verbalized understanding.   No orders of the defined types were placed in this encounter.    Requested Prescriptions    No prescriptions requested or ordered in this encounter    No follow-ups on file.  Camillia Herter, NP
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
## 2022-10-18 ENCOUNTER — Encounter: Payer: Self-pay | Admitting: Obstetrics & Gynecology

## 2022-10-18 ENCOUNTER — Ambulatory Visit (INDEPENDENT_AMBULATORY_CARE_PROVIDER_SITE_OTHER): Payer: 59 | Admitting: Obstetrics & Gynecology

## 2022-10-18 ENCOUNTER — Other Ambulatory Visit (HOSPITAL_COMMUNITY)
Admission: RE | Admit: 2022-10-18 | Discharge: 2022-10-18 | Disposition: A | Discharge status: Home / Self Care | Payer: 59 | Source: Ambulatory Visit

## 2022-10-18 ENCOUNTER — Other Ambulatory Visit (HOSPITAL_COMMUNITY)
Admission: RE | Admit: 2022-10-18 | Discharge: 2022-10-18 | Disposition: A | Discharge status: Home / Self Care | Payer: 59 | Source: Ambulatory Visit | Attending: Obstetrics & Gynecology | Admitting: Obstetrics & Gynecology

## 2022-10-18 ENCOUNTER — Ambulatory Visit: Payer: 59 | Admitting: Family

## 2022-10-18 VITALS — BP 122/85 | HR 76 | Ht 68.0 in | Wt 168.0 lb

## 2022-10-18 DIAGNOSIS — N870 Mild cervical dysplasia: Secondary | ICD-10-CM | POA: Diagnosis not present

## 2022-10-18 DIAGNOSIS — R87612 Low grade squamous intraepithelial lesion on cytologic smear of cervix (LGSIL): Secondary | ICD-10-CM | POA: Diagnosis not present

## 2022-10-18 DIAGNOSIS — N72 Inflammatory disease of cervix uteri: Secondary | ICD-10-CM | POA: Diagnosis not present

## 2022-10-18 DIAGNOSIS — B977 Papillomavirus as the cause of diseases classified elsewhere: Secondary | ICD-10-CM | POA: Diagnosis not present

## 2022-10-18 NOTE — Progress Notes (Signed)
Patient ID: Carrie Hudson, female   DOB: [birth year], 27 y.o.   MRN: [id number]  Chief Complaint  Patient presents with   New Patient (Initial Visit)   Colposcopy   [id number]  HPI Carrie Hudson is a 27 y.o. female.  No LMP recorded.  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 Previous colposcopy: . Prior cervical treatment: no treatment.  Past Medical History:  Diagnosis Date   Allergy    Asthma    Diabetes mellitus without complication ([place])     Past Surgical History:  Procedure Laterality Date   TONSILLECTOMY      Family History  Problem Relation Age of Onset   Diabetes Father    Diabetes Paternal Grandfather    Sickle cell anemia Brother     Social History Social History   Tobacco Use   Smoking status: Never    Passive exposure: Never   Smokeless tobacco: Never  Vaping Use   Vaping Use: Never used  Substance Use Topics   Alcohol use: Yes    Alcohol/week: 1.0 standard drink of alcohol    Types: 1 Shots of liquor per week    Comment: once every 2 wks   Drug use: Never    No Known Allergies  Current Outpatient Medications  Medication Sig Dispense Refill   insulin degludec (TRESIBA FLEXTOUCH) 100 UNIT/ML FlexTouch Pen Inject 24 Units into the skin daily. 15 mL 1   insulin lispro (HUMALOG KWIKPEN) 100 UNIT/ML KwikPen Inject 8 Units into the skin 3 (three) times daily. 15 mL 1   albuterol (VENTOLIN HFA) 108 (90 Base) MCG/ACT inhaler Inhale 1 puff into the lungs every 6 (six) hours as needed for shortness of breath. 18 g 1   atorvastatin (LIPITOR) 20 MG tablet Take 1 tablet (20 mg total) by mouth daily. 30 tablet 2   blood glucose meter kit and supplies KIT Dispense based on patient and insurance preference. Check glucose before breakfast. E11.29 1 each 0   Continuous Blood Gluc Receiver (FREESTYLE LIBRE 2 READER) DEVI Use to check blood sugar TID. E10.65 1 each 0    Continuous Blood Gluc Sensor (DEXCOM G7 SENSOR) MISC Change sensor every 10 days. 3 each 11   Continuous Blood Gluc Sensor (FREESTYLE LIBRE 2 SENSOR) MISC Use to check blood sugar TID. Change sensors once every 2 weeks. E10.65 2 each 3   Fluticasone Propionate (FLONASE NA) Place into the nose.     Fluticasone-Salmeterol (ADVAIR DISKUS) 250-50 MCG/DOSE AEPB Inhale 1 puff into the lungs 2 (two) times daily. 60 each 1   Insulin Pen Needle 32G X 4 MM MISC 1 Device by Does not apply route as directed. 150 each 6   neomycin-polymyxin-hydrocortisone (CORTISPORIN) OTIC solution Place 3 drops into the right ear 4 (four) times daily. X 7 days 10 mL 0   OneTouch Delica Lancets 98X MISC USE TO CHECK BLOOD GLUCOSE BEFORE BREAKFAST     ONETOUCH ULTRA test strip CHECK BLOOD GLUCOSE BEFORE BREAKFAST 100 strip 1   No current facility-administered medications for this visit.    Review of Systems Review of Systems  Constitutional: Negative.   Genitourinary:  Positive for menstrual problem (spotting after 6 mo with Depo Provera). Negative for vaginal discharge.    Blood pressure 122/85, pulse 76, height 5\' 8"  (1.727 m), weight 168 lb (76.2 kg).  Physical Exam Physical Exam Vitals and nursing note reviewed. Exam conducted with a chaperone present.  Constitutional:      Appearance: Normal appearance.  Cardiovascular:     Rate and Rhythm: Normal rate.  Pulmonary:     Effort: Pulmonary effort is normal.  Genitourinary:    General: Normal vulva.     Exam position: Lithotomy position.     Vagina: Normal. No vaginal discharge.     Cervix: Normal.    Patient given informed consent, signed copy in the chart, time out was performed.  Placed in lithotomy position. Cervix viewed with speculum and colposcope after application of acetic acid.   Colposcopy adequate?  yes Acetowhite lesions?yes 6 Punctation?no Mosaicism?  no Abnormal vasculature?  no Biopsies?12 and 6 ECC?yes  COMMENTS: Patient was  given post procedure instructions.    Data Reviewed Pap 06/2022  Assessment    Procedure Details  The risks and benefits of the procedure and Written informed consent obtained.  Speculum placed in vagina and excellent visualization of cervix achieved, cervix swabbed x 3 with acetic acid solution.    Complications: none.     Plan    Specimens labelled and sent to Pathology.       Emeterio Reeve 10/18/2022, 10:00 AM

## 2022-10-18 NOTE — Progress Notes (Signed)
Pt states she has been on Depo and has had irregular spotting. Last injection was about 3 months ago.

## 2022-10-19 LAB — CERVICOVAGINAL ANCILLARY ONLY
Bacterial Vaginitis (gardnerella): NEGATIVE
Candida Glabrata: NEGATIVE
Candida Vaginitis: NEGATIVE
Chlamydia: NEGATIVE
Comment: NEGATIVE
Comment: NEGATIVE
Comment: NEGATIVE
Comment: NEGATIVE
Comment: NEGATIVE
Comment: NORMAL
Neisseria Gonorrhea: NEGATIVE
Trichomonas: NEGATIVE

## 2022-10-19 LAB — SURGICAL PATHOLOGY

## 2022-10-19 NOTE — Progress Notes (Signed)
Advised of colpo results and recommendation for repeat pap in one year. Verbalized understanding.

## 2022-10-19 NOTE — Progress Notes (Signed)
Low grade,  CIN 1, repeat pap in 12 months

## 2022-10-24 NOTE — Progress Notes (Unsigned)
Patient ID: Carrie Hudson, female    DOB: 04-05-1996  MRN: [id number]  CC: No chief complaint on file.   Subjective: Carrie Hudson is a 27 y.o. female who presents for  Her concerns today include:    Sent Referral to [facility]   Ph# [phone] Fax [phone] address [address] [place], [place].     08/22/2022 [facility]  Hope this letter finds you well.   Our office has received a referral appointment request on your behalf that we would like to get scheduled for you.  We have attempted to contact you to schedule an appointment; however, have not been able to reach you.   All of [place] at [facility] are committed to the continuation of your care and would like to hear back from you if you choose to come to [place] for your health care needs.  If we do not hear from you no later than 5 days from the date of this letter, we will assume that you have elected to go to another office. We will close this referral request and update the referring physician's office.   We hope to hear from you soon.   Sincerely,   05/31/2022 DM  - Hemoglobin A1c not at goal 14.2%, goal < 7%. This is increased from previous 12.8%. - Increase Insulin Glargine from 16 units daily to 20 units daily x 7 days. Then increase to 23 units continuing. - Discussed with patient the importance of checking blood sugars at home. - Follow-up with clinical pharmacist Acquanetta Belling Ausdall in 2 weeks or sooner if needed. Medications may be revised at that time if needed.  - New referral to Endocrinology ordered. Also, message sent to referral coordinator Maren Reamer to see if order can be expedited.   HLD  - Atorvastatin  Patient Active Problem List   Diagnosis Date Noted   LGSIL on Pap smear of cervix 10/18/2022   Hyperlipidemia 06/01/2022   Gonorrhea 06/01/2022   Chlamydia 06/01/2022   Trichomonas vaginitis  06/01/2022   BV (bacterial vaginosis) 06/01/2022   Candida vaginitis 06/01/2022   Pain in joint of right shoulder 12/14/2020   Sickle cell trait ([place]) 07/04/2019   Type 1 diabetes mellitus with hyperglycemia ([place]) 07/04/2019   Mild persistent asthma with acute exacerbation 07/04/2019     Current Outpatient Medications on File Prior to Visit  Medication Sig Dispense Refill   albuterol (VENTOLIN HFA) 108 (90 Base) MCG/ACT inhaler Inhale 1 puff into the lungs every 6 (six) hours as needed for shortness of breath. 18 g 1   atorvastatin (LIPITOR) 20 MG tablet Take 1 tablet (20 mg total) by mouth daily. 30 tablet 2   blood glucose meter kit and supplies KIT Dispense based on patient and insurance preference. Check glucose before breakfast. E11.29 1 each 0   Continuous Blood Gluc Receiver (FREESTYLE LIBRE 2 READER) DEVI Use to check blood sugar TID. E10.65 1 each 0   Continuous Blood Gluc Sensor (DEXCOM G7 SENSOR) MISC Change sensor every 10 days. 3 each 11   Continuous Blood Gluc Sensor (FREESTYLE LIBRE 2 SENSOR) MISC Use to check blood sugar TID. Change sensors once every 2 weeks. E10.65 2 each 3   Fluticasone Propionate (FLONASE NA) Place into the nose.     Fluticasone-Salmeterol (ADVAIR DISKUS) 250-50 MCG/DOSE AEPB Inhale 1 puff into the lungs 2 (two) times daily. 60 each 1   insulin  degludec (TRESIBA FLEXTOUCH) 100 UNIT/ML FlexTouch Pen Inject 24 Units into the skin daily. 15 mL 1   insulin lispro (HUMALOG KWIKPEN) 100 UNIT/ML KwikPen Inject 8 Units into the skin 3 (three) times daily. 15 mL 1   Insulin Pen Needle 32G X 4 MM MISC 1 Device by Does not apply route as directed. 150 each 6   neomycin-polymyxin-hydrocortisone (CORTISPORIN) OTIC solution Place 3 drops into the right ear 4 (four) times daily. X 7 days 10 mL 0   OneTouch Delica Lancets 33G MISC USE TO CHECK BLOOD GLUCOSE BEFORE BREAKFAST     ONETOUCH ULTRA test strip CHECK BLOOD GLUCOSE BEFORE BREAKFAST 100 strip 1   [DISCONTINUED]  insulin aspart (NOVOLOG FLEXPEN) 100 UNIT/ML FlexPen Inject 6 Units into the skin 3 (three) times daily with meals. 15 mL 11   No current facility-administered medications on file prior to visit.    No Known Allergies  Social History   Socioeconomic History   Marital status: Single    Spouse name: Not on file   Number of children: Not on file   Years of education: Not on file   Highest education level: Not on file  Occupational History   Not on file  Tobacco Use   Smoking status: Never    Passive exposure: Never   Smokeless tobacco: Never  Vaping Use   Vaping Use: Never used  Substance and Sexual Activity   Alcohol use: Yes    Alcohol/week: 1.0 standard drink of alcohol    Types: 1 Shots of liquor per week    Comment: once every 2 wks   Drug use: Never   Sexual activity: Yes  Other Topics Concern   Not on file  Social History Narrative   Not on file   Social Determinants of Health   Financial Resource Strain: Not on file  Food Insecurity: Not on file  Transportation Needs: Not on file  Physical Activity: Not on file  Stress: Not on file  Social Connections: Not on file  Intimate Partner Violence: Not on file    Family History  Problem Relation Age of Onset   Diabetes Father    Diabetes Paternal Grandfather    Sickle cell anemia Brother     Past Surgical History:  Procedure Laterality Date   TONSILLECTOMY      ROS: Review of Systems Negative except as stated above  PHYSICAL EXAM: There were no vitals taken for this visit.  Physical Exam  {female adult master:310786} {female adult master:310785}     Latest Ref Rng & Units 05/31/2022   11:25 AM 10/14/2019   12:23 PM 06/19/2019   12:00 AM  CMP  Glucose 70 - 99 mg/dL 751  025    BUN 6 - 20 mg/dL 17  9    Creatinine 8.52 - 1.00 mg/dL 7.78  2.42  1.0      Sodium 134 - 144 mmol/L 132  134  134      Potassium 3.5 - 5.2 mmol/L 5.0  4.2  4.3      Chloride 96 - 106 mmol/L 93  100    CO2 20 - 29 mmol/L  22  19    Calcium 8.7 - 10.2 mg/dL 35.3  9.5    Total Protein 6.0 - 8.5 g/dL 7.3     Total Bilirubin 0.0 - 1.2 mg/dL 0.3     Alkaline Phos 44 - 121 IU/L 163     AST 0 - 40 IU/L 11  ALT 0 - 32 IU/L 9        This result is from an external source.    Lipid Panel     Component Value Date/Time   CHOL 201 (H) 07/05/2022 1008   TRIG 68 07/05/2022 1008   HDL 48 07/05/2022 1008   CHOLHDL 4.2 07/05/2022 1008   LDLCALC 141 (H) 07/05/2022 1008    CBC    Component Value Date/Time   WBC 6.4 05/31/2022 1125   RBC 4.99 05/31/2022 1125   HGB 12.5 05/31/2022 1125   HCT 40.5 05/31/2022 1125   PLT 219 05/31/2022 1125   MCV 81 05/31/2022 1125   MCH 25.1 (L) 05/31/2022 1125   MCHC 30.9 (L) 05/31/2022 1125   RDW 13.9 05/31/2022 1125    ASSESSMENT AND PLAN:  There are no diagnoses linked to this encounter.   Patient was given the opportunity to ask questions.  Patient verbalized understanding of the plan and was able to repeat key elements of the plan. Patient was given clear instructions to go to Emergency Department or return to medical center if symptoms don't improve, worsen, or new problems develop.The patient verbalized understanding.   No orders of the defined types were placed in this encounter.    Requested Prescriptions    No prescriptions requested or ordered in this encounter    No follow-ups on file.  Camillia Herter, NP

## 2022-10-25 ENCOUNTER — Other Ambulatory Visit: Payer: Self-pay

## 2022-10-25 ENCOUNTER — Ambulatory Visit (INDEPENDENT_AMBULATORY_CARE_PROVIDER_SITE_OTHER): Payer: 59 | Admitting: Family

## 2022-10-25 ENCOUNTER — Encounter: Payer: Self-pay | Admitting: Family

## 2022-10-25 VITALS — BP 118/78 | HR 87 | Temp 98.0°F | Resp 16 | Ht 68.11 in | Wt 168.0 lb

## 2022-10-25 DIAGNOSIS — E119 Type 2 diabetes mellitus without complications: Secondary | ICD-10-CM

## 2022-10-25 DIAGNOSIS — E1065 Type 1 diabetes mellitus with hyperglycemia: Secondary | ICD-10-CM

## 2022-10-25 DIAGNOSIS — J45909 Unspecified asthma, uncomplicated: Secondary | ICD-10-CM | POA: Insufficient documentation

## 2022-10-25 DIAGNOSIS — E7800 Pure hypercholesterolemia, unspecified: Secondary | ICD-10-CM | POA: Insufficient documentation

## 2022-10-25 DIAGNOSIS — E785 Hyperlipidemia, unspecified: Secondary | ICD-10-CM

## 2022-10-25 DIAGNOSIS — Z0100 Encounter for examination of eyes and vision without abnormal findings: Secondary | ICD-10-CM

## 2022-10-25 MED ORDER — ATORVASTATIN CALCIUM 20 MG PO TABS
20.0000 mg | ORAL_TABLET | Freq: Every day | ORAL | 2 refills | Status: DC
  Filled 2022-10-25 – 2022-11-10 (×2): qty 30, 30d supply, fill #0
  Filled 2023-05-25: qty 30, 30d supply, fill #1

## 2022-10-25 NOTE — Progress Notes (Signed)
Pt presents for referral for ophthalmologist -needs diabetic eye exam  -needs pen needles refill and Atorvastatin

## 2022-10-25 NOTE — Patient Instructions (Signed)
Please call [facility]ocrinology to schedule an appointment for diabetes type 1 evaluation. Phone #: [phone].  Type 1 Diabetes Mellitus, Self-Care, Adult When you have type 1 diabetes (type 1 diabetes mellitus), you need to make sure your blood sugar (glucose) stays in a healthy range. You can do this with: Insulin. Healthy foods. Exercise. Lifestyle changes. Other medicines, if needed. Support from doctors and others. What are the risks? Having diabetes can put you at risk for heart disease and kidney disease. These problems can get worse if you do not take good care of yourself and keep your blood sugar in a healthy range. How to stay aware of blood sugar  Check your blood sugar often. Have your A1C (hemoglobin A1C) level checked two or more times a year. Have it checked more often if your doctor tells you to. Try to meet the treatment goals set by your doctor. Try to have these blood sugar levels: Before meals (preprandial): 80-130 mg/dL (4.4-7.2 mmol/L). After meals (postprandial): below 180 mg/dL (10 mmol/L). A1C level: less than 7%. Follow these instructions at home: Medicines Take over-the-counter and prescription medicines only as told by your doctor. Take insulin and medicines every day as told. Do not run out of insulin or medicines. Change the amount of insulin you take based on how active you are and what foods you eat. Your doctor will tell you the amount to take. Eating and drinking  Choose healthy foods, such as: Low-fat (lean) proteins. Complex carbs (carbohydrates), such aswhole wheat. Fresh fruits and vegetables. Low-fat dairy products. Healthy fats. Meet with a food expert (dietitian) to help you make an eating plan that is right for you. Be sure to: Follow instructions from your doctor about what you cannot eat or drink. Drink enough fluid to keep your pee (urine) pale yellow. Keep track of how many grams of carbs you eat. Read food labels  and learn food serving sizes. Follow your sick-day plan when you cannot eat or drink normally. Make this plan with your doctor so it is ready. Have a 15-gram fast-acting carb snack with you at all times to treat low blood sugar. Activity Get regular exercise as told. Spread out your exercise over 3 or more days a week. Do not go more than 2 days in a row without exercise. Talk with your doctor before you start a new exercise. Lifestyle Do not smoke or use any products that contain nicotine or tobacco. If you need help quitting, ask your doctor. Do not drink alcohol if: Your doctor tells you not to drink. You are pregnant, may be pregnant, or are planning to become pregnant. If you drink alcohol: Limit how much you have to: 0-1 drink a day for women. 0-2 drinks a day for men. Know how much alcohol is in your drink. In the [place] one drink equals one 12 oz bottle of beer (355 mL), one 5 oz glass of wine (148 mL), or one 1 oz glass of hard liquor (44 mL). Learn to lower stress. If you need help, ask your doctor. Body care  Stay up to date with your shots (vaccinations) as told by your doctor. Get shots for: The flu. Pneumonia. Hepatitis B. Have your eyes and feet checked by a doctor as often as told. Check your skin and feet every day. Check for cuts, bruises, redness, blisters, or sores. Brush your teeth and gums two times a day. Floss one or more times a day. Go to the dentist one or  more times every 6 months. Stay at a healthy weight. General instructions Share your diabetes care plan with people at work, school, and home. Make sure your family and close friends learn the symptoms of low blood sugar and can help treat you. You will need their help if you cannot treat yourself. Check your pee for ketones when you are sick and as told. Carry a card or wear jewelry that says you have diabetes. Keep all follow-up visits. Questions to ask your doctor Do I need to meet with a diabetes  educator? Where can I find a support group for people with diabetes? Should I have a glucagon shot kit at home? Where to find more information American Diabetes Association (ADA): diabetes.org Association of Diabetes Care and Education Specialists (ADCES): diabeteseducator.org International Diabetes Federation (IDF): https://www.munoz-bell.org/ Get help right away if: Your blood sugar is below 54 mg/dL (3 mmol/L). You have medium or large levels of ketones in your pee. These symptoms may be an emergency. Get help right away. Call 911. Do not wait to see if the symptoms will go away. Do not drive yourself to the hospital. Summary When you have type 1 diabetes, you must make sure your blood sugar (glucose) stays in a healthy range. Diabetes can raise your risk for heart disease and kidney disease. These problems can get worse if you do not take good care of yourself and keep your blood sugar in a healthy range. Check your blood sugar often. Keep all follow-up visits. This information is not intended to replace advice given to you by your health care provider. Make sure you discuss any questions you have with your health care provider. Document Revised: 09/20/2021 Document Reviewed: 09/20/2021 Elsevier Patient Education  [employer].

## 2022-10-26 LAB — LIPID PANEL
Chol/HDL Ratio: 3.7 ratio (ref 0.0–4.4)
Cholesterol, Total: 206 mg/dL — ABNORMAL HIGH (ref 100–199)
HDL: 55 mg/dL (ref >39)
LDL Chol Calc (NIH): 132 mg/dL — ABNORMAL HIGH (ref 0–99)
Triglycerides: 105 mg/dL (ref 0–149)
VLDL Cholesterol Cal: 19 mg/dL (ref 5–40)

## 2022-10-31 ENCOUNTER — Encounter: Payer: Self-pay | Admitting: Family

## 2022-10-31 NOTE — Telephone Encounter (Signed)
Carrie Hudson,   Please contact the office of Jacelyn Pi, MD located at [facility] on patient's behalf to notify them of patient's message and request.   Thank you

## 2022-11-01 ENCOUNTER — Other Ambulatory Visit: Payer: Self-pay

## 2022-11-10 ENCOUNTER — Other Ambulatory Visit: Payer: Self-pay

## 2022-12-28 ENCOUNTER — Telehealth: Payer: Self-pay | Admitting: Family

## 2023-01-02 ENCOUNTER — Telehealth: Payer: Self-pay | Admitting: Family

## 2023-01-02 NOTE — Telephone Encounter (Signed)
Forms were faxed to pt's endocrinologist specialist Dr Jacelyn Pi, form faxed back to their company to advise of correct provider to send to

## 2023-01-02 NOTE — Telephone Encounter (Signed)
Summer is calling in requesting a prescription in progress notes and it needs to be the prescription they sent over as well as the most recent office visit notes. Per Summer this needs to be completed before 5 PM today or the pt's case will be closed. Per Summer they originally sent the request over on 12/07/22 and most recent request was sent on 12/28/22. Summer says you can follow up with Prentiss Bells [phone] ) regarding this.

## 2023-01-05 NOTE — Telephone Encounter (Signed)
Message Routed to nurse

## 2023-01-09 ENCOUNTER — Other Ambulatory Visit: Payer: Self-pay

## 2023-03-22 ENCOUNTER — Other Ambulatory Visit (HOSPITAL_COMMUNITY): Payer: Self-pay

## 2023-04-11 DIAGNOSIS — H1045 Other chronic allergic conjunctivitis: Secondary | ICD-10-CM | POA: Diagnosis not present

## 2023-04-11 DIAGNOSIS — D573 Sickle-cell trait: Secondary | ICD-10-CM | POA: Diagnosis not present

## 2023-04-11 DIAGNOSIS — E109 Type 1 diabetes mellitus without complications: Secondary | ICD-10-CM | POA: Diagnosis not present

## 2023-04-11 LAB — HM DIABETES EYE EXAM

## 2023-04-16 ENCOUNTER — Other Ambulatory Visit: Payer: Self-pay | Admitting: Orthopedic Surgery

## 2023-04-16 ENCOUNTER — Ambulatory Visit (HOSPITAL_COMMUNITY)
Admission: RE | Admit: 2023-04-16 | Discharge: 2023-04-16 | Disposition: A | Discharge status: Home / Self Care | Payer: 59 | Source: Ambulatory Visit | Attending: Orthopedic Surgery | Admitting: Orthopedic Surgery

## 2023-04-16 DIAGNOSIS — S6992XA Unspecified injury of left wrist, hand and finger(s), initial encounter: Secondary | ICD-10-CM | POA: Diagnosis not present

## 2023-04-16 DIAGNOSIS — M79645 Pain in left finger(s): Secondary | ICD-10-CM | POA: Diagnosis not present

## 2023-04-16 NOTE — Progress Notes (Signed)
Ordered wrong side x-ray

## 2023-04-16 NOTE — Progress Notes (Signed)
Carrie Hudson was playing football this weekend and hurt her left ring finger. Will get x-rays to evaluate.   Freeman Caldron, PA-C Orthopedic Surgery [phone]

## 2023-04-18 ENCOUNTER — Ambulatory Visit (INDEPENDENT_AMBULATORY_CARE_PROVIDER_SITE_OTHER): Payer: 59 | Admitting: Family

## 2023-04-18 ENCOUNTER — Encounter: Payer: Self-pay | Admitting: Family

## 2023-04-18 ENCOUNTER — Other Ambulatory Visit: Payer: Self-pay

## 2023-04-18 VITALS — BP 112/80 | HR 78 | Temp 98.5°F | Ht 67.0 in | Wt 162.4 lb

## 2023-04-18 DIAGNOSIS — Z794 Long term (current) use of insulin: Secondary | ICD-10-CM | POA: Diagnosis not present

## 2023-04-18 DIAGNOSIS — E119 Type 2 diabetes mellitus without complications: Secondary | ICD-10-CM

## 2023-04-18 DIAGNOSIS — E1065 Type 1 diabetes mellitus with hyperglycemia: Secondary | ICD-10-CM | POA: Diagnosis not present

## 2023-04-18 LAB — POCT GLYCOSYLATED HEMOGLOBIN (HGB A1C): HbA1c, POC (controlled diabetic range): 7.5 % — AB (ref 0.0–7.0)

## 2023-04-18 MED ORDER — INSULIN LISPRO (1 UNIT DIAL) 100 UNIT/ML (KWIKPEN)
8.0000 [IU] | PEN_INJECTOR | Freq: Three times a day (TID) | SUBCUTANEOUS | 1 refills | Status: AC
Start: 2023-04-18 — End: ?
  Filled 2023-04-18 – 2023-08-09 (×3): qty 15, 63d supply, fill #0
  Filled 2023-10-17 – 2023-12-07 (×2): qty 15, 63d supply, fill #1

## 2023-04-18 NOTE — Progress Notes (Signed)
Patient ID: SATIN BOAL, female    DOB: 23-Apr-1996  MRN: [id number]  CC: Chronic Care Management   Subjective: Carrie Hudson is a 27 y.o. female who presents for chronic care management.   Her concerns today include:  Needs refills of Humalog. Reports she is not taking [place] as prescribed and she is unsure why. She does not monitor what she eats. She does not exercise outside of normal routine. She denies red flag symptoms associated with diabetes. Reports she had an appointment with [facility] several months ago and did not follow-up as of present. No further issues/concerns for discussion today.    Patient Active Problem List   Diagnosis Date Noted   Asthma 10/25/2022   Pure hypercholesterolemia 10/25/2022   LGSIL on Pap smear of cervix 10/18/2022   Hyperlipidemia 06/01/2022   Gonorrhea 06/01/2022   Chlamydia 06/01/2022   Trichomonas vaginitis 06/01/2022   BV (bacterial vaginosis) 06/01/2022   Candida vaginitis 06/01/2022   Pain in joint of right shoulder 12/14/2020   Sickle cell trait (HCC) 07/04/2019   Hyperglycemia due to type 1 diabetes mellitus (HCC) 07/04/2019   Mild persistent asthma with acute exacerbation 07/04/2019     Current Outpatient Medications on File Prior to Visit  Medication Sig Dispense Refill   albuterol (VENTOLIN HFA) 108 (90 Base) MCG/ACT inhaler Inhale 1 puff into the lungs every 6 (six) hours as needed for shortness of breath. 18 g 1   atorvastatin (LIPITOR) 20 MG tablet Take 1 tablet (20 mg total) by mouth daily. 30 tablet 2   Continuous Blood Gluc Receiver (FREESTYLE LIBRE 2 READER) DEVI Use to check blood sugar TID. E10.65 1 each 0   Continuous Blood Gluc Sensor (DEXCOM G7 SENSOR) MISC Change sensor every 10 days. 3 each 11   Continuous Blood Gluc Sensor (FREESTYLE LIBRE 2 SENSOR) MISC Use to check blood sugar TID. Change sensors once every 2 weeks. E10.65 2 each 3   Fluticasone Propionate (FLONASE NA) Place into the nose.      insulin degludec (TRESIBA FLEXTOUCH) 100 UNIT/ML FlexTouch Pen Inject 24 Units into the skin daily. 15 mL 1   Insulin Pen Needle 32G X 4 MM MISC 1 Device by Does not apply route as directed. 150 each 6   OneTouch Delica Lancets 33G MISC USE TO CHECK BLOOD GLUCOSE BEFORE BREAKFAST     ONETOUCH ULTRA test strip CHECK BLOOD GLUCOSE BEFORE BREAKFAST 100 strip 1   blood glucose meter kit and supplies KIT Dispense based on patient and insurance preference. Check glucose before breakfast. E11.29 (Patient not taking: Reported on 04/18/2023) 1 each 0   Fluticasone-Salmeterol (ADVAIR DISKUS) 250-50 MCG/DOSE AEPB Inhale 1 puff into the lungs 2 (two) times daily. (Patient not taking: Reported on 04/18/2023) 60 each 1   neomycin-polymyxin-hydrocortisone (CORTISPORIN) OTIC solution Place 3 drops into the right ear 4 (four) times daily. X 7 days (Patient not taking: Reported on 04/18/2023) 10 mL 0   [DISCONTINUED] insulin aspart (NOVOLOG FLEXPEN) 100 UNIT/ML FlexPen Inject 6 Units into the skin 3 (three) times daily with meals. 15 mL 11   No current facility-administered medications on file prior to visit.    No Known Allergies  Social History   Socioeconomic History   Marital status: Single    Spouse name: Not on file   Number of children: Not on file   Years of education: Not on file   Highest education level: Not on file  Occupational History   Not on file  Tobacco Use   Smoking status: Never    Passive exposure: Never   Smokeless tobacco: Never  Vaping Use   Vaping status: Never Used  Substance and Sexual Activity   Alcohol use: Yes    Alcohol/week: 1.0 standard drink of alcohol    Types: 1 Shots of liquor per week    Comment: once every 2 wks   Drug use: Never   Sexual activity: Yes  Other Topics Concern   Not on file  Social History Narrative   Not on file   Social Determinants of Health   Financial Resource Strain: Not on file  Food Insecurity: Not on file  Transportation Needs:  Not on file  Physical Activity: Not on file  Stress: Not on file  Social Connections: Not on file  Intimate Partner Violence: Not on file    Family History  Problem Relation Age of Onset   Diabetes Father    Diabetes Paternal Grandfather    Sickle cell anemia Brother     Past Surgical History:  Procedure Laterality Date   TONSILLECTOMY      ROS: Review of Systems Negative except as stated above  PHYSICAL EXAM: BP 112/80   Pulse 78   Temp 98.5 F (36.9 C) (Oral)   Ht 5\' 7"  (1.702 m)   Wt 162 lb 6.4 oz (73.7 kg)   LMP 04/02/2023 (Approximate)   SpO2 96%   BMI 25.44 kg/m   Physical Exam HENT:     Head: Normocephalic and atraumatic.     Nose: Nose normal.     Mouth/Throat:     Mouth: Mucous membranes are moist.     Pharynx: Oropharynx is clear.  Eyes:     Extraocular Movements: Extraocular movements intact.     Conjunctiva/sclera: Conjunctivae normal.     Pupils: Pupils are equal, round, and reactive to light.  Cardiovascular:     Rate and Rhythm: Normal rate and regular rhythm.     Pulses: Normal pulses.     Heart sounds: Normal heart sounds.  Pulmonary:     Effort: Pulmonary effort is normal.     Breath sounds: Normal breath sounds.  Musculoskeletal:        General: Normal range of motion.     Right shoulder: Normal.     Left shoulder: Normal.     Right upper arm: Normal.     Left upper arm: Normal.     Right elbow: Normal.     Left elbow: Normal.     Right forearm: Normal.     Left forearm: Normal.     Right wrist: Normal.     Left wrist: Normal.     Right hand: Normal.     Left hand: Normal.     Cervical back: Normal, normal range of motion and neck supple.     Thoracic back: Normal.     Lumbar back: Normal.     Right hip: Normal.     Left hip: Normal.     Right upper leg: Normal.     Left upper leg: Normal.     Right knee: Normal.     Left knee: Normal.     Right lower leg: Normal.     Left lower leg: Normal.     Right ankle: Normal.      Left ankle: Normal.     Right foot: Normal.     Left foot: Normal.  Skin:    General: Skin is warm and dry.  Neurological:  General: No focal deficit present.     Mental Status: She is alert and oriented to person, place, and time.  Psychiatric:        Mood and Affect: Mood normal.        Behavior: Behavior normal.     ASSESSMENT AND PLAN: 1. Uncontrolled type 1 diabetes mellitus with hyperglycemia (HCC) - Continue Insulin Degludec as prescribed. No refills needed as of present.  - Continue Insulin Lispro as prescribed.  - Routine screening.  - Discussed the importance of healthy eating habits, low-carbohydrate diet, low-sugar diet, regular aerobic exercise (at least 150 minutes a week as tolerated) and medication compliance to achieve or maintain control of diabetes. - Keep all scheduled appointments with [facility].  - Follow-up with primary provider as scheduled.  - POCT glycosylated hemoglobin (Hb A1C) - Microalbumin / creatinine urine ratio - Basic Metabolic Panel - insulin lispro (HUMALOG KWIKPEN) 100 UNIT/ML KwikPen; Inject 8 Units into the skin 3 (three) times daily.  Dispense: 15 mL; Refill: 1  2. Encounter for diabetic foot exam (HCC) - Completed today in office.    Patient was given the opportunity to ask questions.  Patient verbalized understanding of the plan and was able to repeat key elements of the plan. Patient was given clear instructions to go to Emergency Department or return to medical center if symptoms don't improve, worsen, or new problems develop.The patient verbalized understanding.   Orders Placed This Encounter  Procedures   Microalbumin / creatinine urine ratio   Basic Metabolic Panel   POCT glycosylated hemoglobin (Hb A1C)     Requested Prescriptions   Signed Prescriptions Disp Refills   insulin lispro (HUMALOG KWIKPEN) 100 UNIT/ML KwikPen 15 mL 1    Sig: Inject 8 Units into the skin 3 (three) times daily.     Follow-up with primary provider as scheduled.   Rema Fendt, NP

## 2023-04-18 NOTE — Progress Notes (Signed)
Pt needs insulin refilled

## 2023-04-19 LAB — BASIC METABOLIC PANEL
BUN/Creatinine Ratio: 15 (ref 9–23)
BUN: 14 mg/dL (ref 6–20)
CO2: 22 mmol/L (ref 20–29)
Calcium: 10 mg/dL (ref 8.7–10.2)
Chloride: 97 mmol/L (ref 96–106)
Creatinine, Ser: 0.92 mg/dL (ref 0.57–1.00)
Glucose: 358 mg/dL — ABNORMAL HIGH (ref 70–99)
Potassium: 4.4 mmol/L (ref 3.5–5.2)
Sodium: 136 mmol/L (ref 134–144)
eGFR: 88 mL/min/{1.73_m2} (ref >59)

## 2023-04-19 LAB — MICROALBUMIN / CREATININE URINE RATIO
Creatinine, Urine: 47.9 mg/dL
Microalb/Creat Ratio: 24 mg/g creat (ref 0–29)
Microalbumin, Urine: 11.3 ug/mL

## 2023-05-25 ENCOUNTER — Other Ambulatory Visit: Payer: Self-pay

## 2023-05-25 ENCOUNTER — Other Ambulatory Visit: Payer: Self-pay | Admitting: Pharmacist

## 2023-05-25 MED ORDER — INSULIN PEN NEEDLE 32G X 4 MM MISC
6 refills | Status: AC
  Filled 2023-05-25: qty 100, 25d supply, fill #0
  Filled 2023-08-09: qty 100, 30d supply, fill #0
  Filled 2023-10-17 – 2023-12-19 (×3): qty 100, 30d supply, fill #1
  Filled 2024-01-14: qty 100, 30d supply, fill #2

## 2023-06-01 ENCOUNTER — Other Ambulatory Visit: Payer: Self-pay

## 2023-06-07 NOTE — Telephone Encounter (Signed)
See routing comment(s) for message information.

## 2023-06-27 ENCOUNTER — Other Ambulatory Visit: Payer: Self-pay

## 2023-07-09 ENCOUNTER — Other Ambulatory Visit (HOSPITAL_COMMUNITY): Payer: Self-pay

## 2023-08-10 ENCOUNTER — Other Ambulatory Visit: Payer: Self-pay

## 2023-10-17 ENCOUNTER — Other Ambulatory Visit: Payer: Self-pay

## 2023-10-17 ENCOUNTER — Telehealth: Payer: Self-pay | Admitting: Family

## 2023-10-17 ENCOUNTER — Encounter: Payer: 59 | Admitting: Family

## 2023-10-17 NOTE — Telephone Encounter (Signed)
Called pt and left vm to call office back to rescheduled missed appt

## 2023-10-17 NOTE — Progress Notes (Signed)
 Erroneous encounter-disregard

## 2023-10-26 ENCOUNTER — Other Ambulatory Visit: Payer: Self-pay

## 2023-11-30 ENCOUNTER — Ambulatory Visit: Payer: Commercial Managed Care - PPO | Admitting: Family Medicine

## 2023-11-30 ENCOUNTER — Encounter: Payer: Self-pay | Admitting: Family Medicine

## 2023-11-30 ENCOUNTER — Other Ambulatory Visit: Payer: Self-pay

## 2023-11-30 VITALS — BP 119/81 | HR 74 | Temp 98.4°F | Resp 18 | Ht 67.0 in | Wt 155.4 lb

## 2023-11-30 DIAGNOSIS — M2550 Pain in unspecified joint: Secondary | ICD-10-CM

## 2023-11-30 DIAGNOSIS — E7800 Pure hypercholesterolemia, unspecified: Secondary | ICD-10-CM | POA: Diagnosis not present

## 2023-11-30 DIAGNOSIS — E1065 Type 1 diabetes mellitus with hyperglycemia: Secondary | ICD-10-CM | POA: Diagnosis not present

## 2023-11-30 MED ORDER — DEXCOM G7 SENSOR MISC
1.0000 | 1 refills | Status: AC
Start: 2023-11-30 — End: ?
  Filled 2023-11-30 – 2023-12-21 (×3): qty 3, 30d supply, fill #0

## 2023-11-30 MED ORDER — LANTUS SOLOSTAR 100 UNIT/ML ~~LOC~~ SOPN
6.0000 [IU] | PEN_INJECTOR | Freq: Every day | SUBCUTANEOUS | 1 refills | Status: AC
Start: 2023-11-30 — End: ?
  Filled 2023-11-30: qty 3, 28d supply, fill #0
  Filled 2023-12-28 – 2024-03-25 (×12): qty 3, 28d supply, fill #1

## 2023-11-30 NOTE — Progress Notes (Signed)
 New Patient Office Visit  Subjective:  Patient ID: Carrie Hudson, female    DOB: Jul 26, 1996  Age: 28 y.o. MRN: [id number]  CC:  Chief Complaint  Patient presents with   Establish Care    Initial visit to establish care with new pcp Would like to get a continuous glucose monitor and insulin pump, possible referral to endo    HPI Carrie Hudson presents for DM1 Novolog 6 units tid  Discussed the use of AI scribe software for clinical note transcription with the patient, who gave verbal consent to proceed.  History of Present Illness   Carrie Hudson is a 28 year old female new pt with type 1 diabetes who presents for diabetes management and joint pain evaluation. Mom-Billy Strickland  She has type 1 diabetes, diagnosed at age 48. Blood sugars are consistently in the low 200s, with occasional drops below 200. She has experienced episodes of hypoglycemia, notably in January when her blood sugar dropped to 43 while sleeping. She currently uses Novolog, taking six units three times a day before meals, but has not used Lantus since her initial diagnosis. She checks her blood sugar regularly and notes that it tends to be around 200 during the day, with fasting levels sometimes around 120. She has not had a recent endocrinologist consultation. has seen Dr. Talmage Nap in past.  she would like a CGM and considering insulin pump for better control od DM  She reports joint pain, which has been increasing over the past few months. The pain is primarily in her shoulders and hips but has started to affect other joints. There is a family history of rheumatoid arthritis, with her mother, grandfather, and uncle affected.  She works as an Set designer at [employer]. She has no history of smoking or drug use. She consumes alcohol occasionally but has not drunk this year. She is single, has no children, identifies as gay, and is sexually active.  No chest pain, shortness of breath, abdominal pain, diarrhea, or  constipation. Muscle and joint pains, particularly in the shoulders and hips. Occasionally experiences seasonal depression and is seeing a therapist.        Current Outpatient Medications:    albuterol (VENTOLIN HFA) 108 (90 Base) MCG/ACT inhaler, Inhale 1 puff into the lungs every 6 (six) hours as needed for shortness of breath., Disp: 18 g, Rfl: 1   blood glucose meter kit and supplies KIT, Dispense based on patient and insurance preference. Check glucose before breakfast. E11.29, Disp: 1 each, Rfl: 0   Continuous Glucose Sensor (DEXCOM G7 SENSOR) MISC, Use as directed every 10 days, Disp: 9 each, Rfl: 1   Fluticasone Propionate (FLONASE NA), Place into the nose., Disp: , Rfl:    insulin glargine (LANTUS SOLOSTAR) 100 UNIT/ML Solostar Pen, Inject 6 Units into the skin daily., Disp: 15 mL, Rfl: 1   insulin lispro (HUMALOG KWIKPEN) 100 UNIT/ML KwikPen, Inject 8 Units into the skin 3 (three) times daily., Disp: 15 mL, Rfl: 1   Insulin Pen Needle 32G X 4 MM MISC, Use to inject insulin as directed., Disp: 100 each, Rfl: 6   OneTouch Delica Lancets 33G MISC, USE TO CHECK BLOOD GLUCOSE BEFORE BREAKFAST, Disp: , Rfl:    ONETOUCH ULTRA test strip, CHECK BLOOD GLUCOSE BEFORE BREAKFAST, Disp: 100 strip, Rfl: 1  Past Medical History:  Diagnosis Date   Allergy    Anemia    Anxiety    Asthma    Depression    Diabetes  mellitus without complication (HCC)    type.1.  dx 28 yo    Past Surgical History:  Procedure Laterality Date   TONSILLECTOMY      Family History  Problem Relation Age of Onset   Arthritis Mother    Diabetes Father    Diabetes Paternal Grandfather    Sickle cell anemia Brother     Social History   Socioeconomic History   Marital status: Single    Spouse name: Not on file   Number of children: 0   Years of education: Not on file   Highest education level: Bachelor's degree (e.g., BA, AB, BS)  Occupational History   Occupation: ortho Theme park manager: [employer]   Tobacco Use   Smoking status: Never    Passive exposure: Never   Smokeless tobacco: Never  Vaping Use   Vaping status: Never Used  Substance and Sexual Activity   Alcohol use: Yes    Alcohol/week: 2.0 standard drinks of alcohol    Types: 2 Shots of liquor per week    Comment: once every 2 wks   Drug use: Never   Sexual activity: Yes    Birth control/protection: None  Other Topics Concern   Not on file  Social History Narrative   Not on file   Social Drivers of Health   Financial Resource Strain: Medium Risk (11/29/2023)   Overall Financial Resource Strain (CARDIA)    Difficulty of Paying Living Expenses: Somewhat hard  Food Insecurity: No Food Insecurity (11/29/2023)   Hunger Vital Sign    Worried About Running Out of Food in the Last Year: Never true    Ran Out of Food in the Last Year: Never true  Transportation Needs: No Transportation Needs (11/29/2023)   PRAPARE - Administrator, Civil Service (Medical): No    Lack of Transportation (Non-Medical): No  Physical Activity: Insufficiently Active (11/29/2023)   Exercise Vital Sign    Days of Exercise per Week: 1 day    Minutes of Exercise per Session: 30 min  Stress: Stress Concern Present (11/29/2023)   [employer] of Occupational Health - Occupational Stress Questionnaire    Feeling of Stress : Rather much  Social Connections: Socially Integrated (11/29/2023)   Social Connection and Isolation Panel [NHANES]    Frequency of Communication with Friends and Family: More than three times a week    Frequency of Social Gatherings with Friends and Family: More than three times a week    Attends Religious Services: More than 4 times per year    Active Member of [employer] or Organizations: Yes    Attends Engineer, structural: More than 4 times per year    Marital Status: Living with partner  Intimate Partner Violence: Not on file    ROS  ROS: Gen: no fever, chills  Eyes: no blurry vision, double  vision Resp: no cough, wheeze,SOB CV: no CP, palpitations, LE edema,  GI: no heartburn, n/v/d/c, abd pain GU: no dysuria, urgency, frequency, hematuria MSK: no joint pain, myalgias, back pain Neuro: no dizziness, headache, weakness, vertigo Psych: no depression, anxiety, insomnia, SI   Objective:   Today's Vitals: BP 119/81   Pulse 74   Temp 98.4 F (36.9 C) (Temporal)   Resp 18   Ht 5\' 7"  (1.702 m)   Wt 155 lb 6 oz (70.5 kg)   LMP 11/16/2023 (Approximate)   SpO2 99%   BMI 24.34 kg/m   Physical Exam  Gen: WDWN NAD HEENT:  NCAT, conjunctiva not injected, sclera nonicteric TM WNL B, OP moist, no exudates  NECK:  supple, no thyromegaly, no nodes, no carotid bruits CARDIAC: RRR, S1S2+, no murmur. DP 2+B LUNGS: CTAB. No wheezes ABDOMEN:  BS+, soft, NTND, No HSM, no masses EXT:  no edema MSK: no gross abnormalities.  NEURO: A&O x3.  CN II-XII intact.  PSYCH: normal mood. Good eye contact   Assessment & Plan:  Hyperglycemia due to type 1 diabetes mellitus (HCC) -     Comprehensive metabolic panel -     Hemoglobin A1c -     TSH -     Microalbumin / creatinine urine ratio -     Lantus SoloStar; Inject 6 Units into the skin daily.  Dispense: 15 mL; Refill: 1 -     Dexcom G7 Sensor; Use as directed every 10 days  Dispense: 9 each; Refill: 1  Pure hypercholesterolemia -     Comprehensive metabolic panel -     Lipid panel  Arthralgia, unspecified joint -     CBC with Differential/Platelet -     Rheumatoid factor -     Sedimentation rate  Assessment and Plan    Type 1 Diabetes Mellitus Diagnosed at age 52, blood glucose levels are consistently in the low 200s with occasional hypoglycemia. Currently using Novolog (6 units TID) but has not used Lantus for years. Interested in a continuous glucose monitor (Dexcom or [place]) and possibly an insulin pump. Needs improved diabetes management and has not seen an endocrinologist recently. Start Lantus at 3 units daily, increasing to 6  units if blood glucose remains high after 3 days. Starting with a lower dose minimizes hypoglycemia risk and allows gradual adjustment. Insurance may require prior authorization for Lantus and Dexcom, causing potential delays. Monitor blood glucose closely and adjust Novolog dosage as needed based on readings. Order A1c and comprehensive blood work including thyroid and cholesterol. Refer to endocrinologist Dr. Talmage Nap for further diabetes management. Prescribe Dexcom continuous glucose monitor. Schedule follow-up appointment in one month.  Rheumatoid Arthritis (RA) Screening Family history of RA with recent onset of joint pain and swelling, particularly in the shoulders and hips, now spreading to other joints. Mother requested screening for RA due to family history. Order rheumatoid factor . Evaluate joint pain and swelling during follow-up visit.  General Health Maintenance No recent blood work or physical exams. History of asthma, allergies, and past tonsillectomy. Does not smoke, vape, or use drugs, with minimal alcohol intake. Discussed birth control options and the importance of using condoms if sexually active with men. Encouraged regular physical activity and healthy lifestyle choices. Order comprehensive blood work including cholesterol and thyroid function tests. Discuss birth control options during the next visit. Encourage regular physical activity and healthy lifestyle choices.  Follow-up Schedule follow-up appointment in one month. Ensure contact with Dr. Debbora Lacrosse office for an endocrinology appointment. Advise to contact the office if there are any issues with medication or if symptoms worsen.        Follow-up: Return in about 4 weeks (around 12/28/2023) for DM.   Angelena Sole, MD

## 2023-11-30 NOTE — Patient Instructions (Signed)
 Welcome to [facility]! It was a pleasure meeting you today.  As discussed, Please schedule a  month follow up visit today.  Call endo  PLEASE NOTE:  If you had any LAB tests please let us know if you have not heard back within a few days. You may see your results on MyChart before we have a chance to review them but we will give you a call once they are reviewed by [place]. If we ordered any REFERRALS today, please let us know if you have not heard from their office within the next week.  Let us know through MyChart if you are needing REFILLS, or have your pharmacy send [place] the request. You can also use MyChart to communicate with me or any office staff.  Please try these tips to maintain a healthy lifestyle:  Eat most of your calories during the day when you are active. Eliminate processed foods including packaged sweets (pies, cakes, cookies), reduce intake of potatoes, white bread, white pasta, and white rice. Look for whole grain options, oat flour or almond flour.  Each meal should contain half fruits/vegetables, one quarter protein, and one quarter carbs (no bigger than a computer mouse).  Cut down on sweet beverages. This includes juice, soda, and sweet tea. Also watch fruit intake, though this is a healthier sweet option, it still contains natural sugar! Limit to 3 servings daily.  Drink at least 1 glass of water with each meal and aim for at least 8 glasses per day  Exercise at least 150 minutes every week.

## 2023-12-01 ENCOUNTER — Encounter: Payer: Self-pay | Admitting: Family Medicine

## 2023-12-01 LAB — TSH: TSH: 0.67 m[IU]/L

## 2023-12-01 LAB — COMPREHENSIVE METABOLIC PANEL
AG Ratio: 1.6 (calc) (ref 1.0–2.5)
ALT: 15 U/L (ref 6–29)
AST: 17 U/L (ref 10–30)
Albumin: 4.6 g/dL (ref 3.6–5.1)
Alkaline phosphatase (APISO): 76 U/L (ref 31–125)
BUN: 8 mg/dL (ref 7–25)
CO2: 27 mmol/L (ref 20–32)
Calcium: 9.7 mg/dL (ref 8.6–10.2)
Chloride: 100 mmol/L (ref 98–110)
Creat: 0.87 mg/dL (ref 0.50–0.96)
Globulin: 2.8 g/dL (ref 1.9–3.7)
Glucose, Bld: 362 mg/dL — ABNORMAL HIGH (ref 65–99)
Potassium: 4.1 mmol/L (ref 3.5–5.3)
Sodium: 135 mmol/L (ref 135–146)
Total Bilirubin: 0.6 mg/dL (ref 0.2–1.2)
Total Protein: 7.4 g/dL (ref 6.1–8.1)

## 2023-12-01 LAB — CBC WITH DIFFERENTIAL/PLATELET
Absolute Lymphocytes: 2686 {cells}/uL (ref 850–3900)
Absolute Monocytes: 482 {cells}/uL (ref 200–950)
Basophils Absolute: 29 {cells}/uL (ref 0–200)
Basophils Relative: 0.4 %
Eosinophils Absolute: 281 {cells}/uL (ref 15–500)
Eosinophils Relative: 3.9 %
HCT: 38.3 % (ref 35.0–45.0)
Hemoglobin: 12.1 g/dL (ref 11.7–15.5)
MCH: 26 pg — ABNORMAL LOW (ref 27.0–33.0)
MCHC: 31.6 g/dL — ABNORMAL LOW (ref 32.0–36.0)
MCV: 82.4 fL (ref 80.0–100.0)
MPV: 13 fL — ABNORMAL HIGH (ref 7.5–12.5)
Monocytes Relative: 6.7 %
Neutro Abs: 3722 {cells}/uL (ref 1500–7800)
Neutrophils Relative %: 51.7 %
Platelets: 264 10*3/uL (ref 140–400)
RBC: 4.65 10*6/uL (ref 3.80–5.10)
RDW: 12.5 % (ref 11.0–15.0)
Total Lymphocyte: 37.3 %
WBC: 7.2 10*3/uL (ref 3.8–10.8)

## 2023-12-01 LAB — LIPID PANEL
Cholesterol: 174 mg/dL (ref <200)
HDL: 61 mg/dL (ref >=50)
LDL Cholesterol (Calc): 99 mg/dL
Non-HDL Cholesterol (Calc): 113 mg/dL (ref <130)
Total CHOL/HDL Ratio: 2.9 (calc) (ref <5.0)
Triglycerides: 48 mg/dL (ref <150)

## 2023-12-01 LAB — HEMOGLOBIN A1C
Hgb A1c MFr Bld: 11 %{Hb} — ABNORMAL HIGH (ref <5.7)
Mean Plasma Glucose: 269 mg/dL
eAG (mmol/L): 14.9 mmol/L

## 2023-12-01 LAB — SEDIMENTATION RATE: Sed Rate: 6 mm/h (ref 0–20)

## 2023-12-01 LAB — RHEUMATOID FACTOR: Rheumatoid fact SerPl-aCnc: <10 [IU]/mL (ref <14)

## 2023-12-01 LAB — MICROALBUMIN / CREATININE URINE RATIO
Creatinine, Urine: 82 mg/dL (ref 20–275)
Microalb Creat Ratio: 15 mg/g{creat} (ref <30)
Microalb, Ur: 1.2 mg/dL

## 2023-12-01 NOTE — Progress Notes (Signed)
 Labs look good except the sugars.  Not sure her meter is correct as sugar was 362.  Hopefully she got the dexcom.  May need new meter as well.  Let me know sugars in 1 wk so can adjust.  Did she call endocrine?

## 2023-12-03 ENCOUNTER — Other Ambulatory Visit: Payer: Self-pay

## 2023-12-07 ENCOUNTER — Other Ambulatory Visit: Payer: Self-pay

## 2023-12-10 ENCOUNTER — Other Ambulatory Visit: Payer: Self-pay

## 2023-12-11 ENCOUNTER — Telehealth: Payer: Self-pay

## 2023-12-11 ENCOUNTER — Other Ambulatory Visit (HOSPITAL_COMMUNITY): Payer: Self-pay

## 2023-12-11 ENCOUNTER — Other Ambulatory Visit: Payer: Self-pay

## 2023-12-11 NOTE — Telephone Encounter (Signed)
 Pharmacy Patient Advocate Encounter   Received notification from [facility] that prior authorization for Dexcom G7 Sensor is required/requested.   Insurance verification completed.   The patient is insured through [facility] .   Per test claim: PA required and submitted KEY/EOC/Request #: [id number] APPROVED from 12/11/2023 to 12/10/2024. Ran test claim, Copay is $69.83. This test claim was processed through [facility]- copay amounts may vary at other pharmacies due to pharmacy/plan contracts, or as the patient moves through the different stages of their insurance plan.

## 2023-12-11 NOTE — Telephone Encounter (Signed)
 Patient notified of message below.

## 2023-12-12 ENCOUNTER — Telehealth: Payer: Self-pay

## 2023-12-12 ENCOUNTER — Other Ambulatory Visit (HOSPITAL_COMMUNITY): Payer: Self-pay

## 2023-12-12 NOTE — Telephone Encounter (Signed)
 Pharmacy Patient Advocate Encounter   Received notification from [employer] that prior authorization for FreeStyle Libre 3 Sensor is required/requested.   Insurance verification completed.   The patient is insured through [facility] .   Per test claim: PA required; PA submitted to above mentioned insurance via CoverMyMeds Key/confirmation #/EOC [id number] Status is pending

## 2023-12-12 NOTE — Telephone Encounter (Signed)
 Pharmacy Patient Advocate Encounter  Received notification from [facility] that Prior Authorization for FREESTYLE LIBRE 3 SENSOR has been APPROVED from 12/12/23 to 12/11/24. Unable to obtain price due to refill too soon rejection, last fill date 12/11/23 next available fill date04/04/25   PA #/Case ID/Reference #: [id number]

## 2023-12-13 ENCOUNTER — Other Ambulatory Visit (HOSPITAL_COMMUNITY): Payer: Self-pay

## 2023-12-18 ENCOUNTER — Encounter: Payer: Self-pay | Admitting: *Deleted

## 2023-12-18 ENCOUNTER — Other Ambulatory Visit: Payer: Self-pay

## 2023-12-18 NOTE — Telephone Encounter (Signed)
 Freestyle PA has been approved, unable to obtain cost due to refill too soon rejection, but pt should be able to see what is cheaper at her pharmacy.

## 2023-12-18 NOTE — Telephone Encounter (Signed)
 Freestyle has been approved as well, pt can choose whichever CGM is cheapest/most comfortable for her. Communicated to clinic in original request.

## 2023-12-18 NOTE — Telephone Encounter (Signed)
 Patient notified of message below.

## 2023-12-20 ENCOUNTER — Other Ambulatory Visit: Payer: Self-pay

## 2023-12-21 ENCOUNTER — Other Ambulatory Visit: Payer: Self-pay

## 2024-01-02 ENCOUNTER — Other Ambulatory Visit: Payer: Self-pay

## 2024-01-02 ENCOUNTER — Ambulatory Visit: Payer: Commercial Managed Care - PPO | Admitting: Family Medicine

## 2024-01-02 ENCOUNTER — Encounter: Payer: Self-pay | Admitting: Family Medicine

## 2024-01-02 VITALS — BP 123/83 | HR 80 | Temp 98.4°F | Resp 16 | Ht 67.0 in | Wt 150.2 lb

## 2024-01-02 DIAGNOSIS — M791 Myalgia, unspecified site: Secondary | ICD-10-CM | POA: Diagnosis not present

## 2024-01-02 DIAGNOSIS — E1065 Type 1 diabetes mellitus with hyperglycemia: Secondary | ICD-10-CM | POA: Diagnosis not present

## 2024-01-02 DIAGNOSIS — E7800 Pure hypercholesterolemia, unspecified: Secondary | ICD-10-CM | POA: Diagnosis not present

## 2024-01-02 MED ORDER — CYCLOBENZAPRINE HCL 5 MG PO TABS
5.0000 mg | ORAL_TABLET | Freq: Three times a day (TID) | ORAL | 1 refills | Status: AC | PRN
  Filled 2024-01-02: qty 30, 10d supply, fill #0

## 2024-01-02 NOTE — Progress Notes (Signed)
 Subjective:     Patient ID: Carrie Hudson, female    DOB: [birth year], 28 y.o.   MRN: [id number]  Chief Complaint  Patient presents with   Medical Management of Chronic Issues    4 week follow-up on dm Having muscle/joint pain, unable to sleep at night    HPI Discussed the use of AI scribe software for clinical note transcription with the patient, who gave verbal consent to proceed.  History of Present Illness Carrie Hudson is a 28 year old female with type 1 diabetes who presents with uncontrolled blood sugar levels and body aches.  She is experiencing difficulties managing her type 1 diabetes. She has started using Lantus at a dose of four units and Humalog at six units with each meal. Despite these measures, her blood sugar levels remain high, often running in the 200s, and occasionally spiking to nearly 300 in the middle of the night. Her lowest reading is 90, but this is infrequent. She uses a Dexcom for continuous glucose monitoring. Recent lab results showed a blood sugar level of 362, with normal kidney and liver function, and a negative rheumatoid factor and sed rate. Her hemoglobin levels were normal, but indices were slightly low, and specific iron levels were not checked. No shortness of breath or chest pain. Her past medical history includes a sickle cell trait, which may affect her hemoglobin A1c readings.  In addition to her diabetes management issues, she reports significant body aches that have worsened over the past few days. These aches are present throughout the day and are exacerbated by physical activity and cold weather exposure, which causes a 'static pain' sensation. She also experiences difficulty sleeping due to discomfort from these aches, describing her body as tense and sore, which prevents restful sleep. She recalls a previous incident in January when her blood sugar dropped to the low 40s, resulting in a temporary loss of motor function and speech. Since then, she has  experienced periodic body aches, which have now become more frequent and severe.    Health Maintenance Due  Topic Date Due   FOOT EXAM  11/11/2020    Past Medical History:  Diagnosis Date   Allergy    Anemia    Anxiety    Asthma    Depression    Diabetes mellitus without complication (HCC)    type.1.  dx 28 yo    Past Surgical History:  Procedure Laterality Date   TONSILLECTOMY       Current Outpatient Medications:    albuterol (VENTOLIN HFA) 108 (90 Base) MCG/ACT inhaler, Inhale 1 puff into the lungs every 6 (six) hours as needed for shortness of breath., Disp: 18 g, Rfl: 1   blood glucose meter kit and supplies KIT, Dispense based on patient and insurance preference. Check glucose before breakfast. E11.29, Disp: 1 each, Rfl: 0   Continuous Glucose Sensor (DEXCOM G7 SENSOR) MISC, Use 1 sensor every 10 days to monitor blood glucose continously., Disp: 9 each, Rfl: 1   cyclobenzaprine (FLEXERIL) 5 MG tablet, Take 1 tablet (5 mg total) by mouth 3 (three) times daily as needed for muscle spasms., Disp: 30 tablet, Rfl: 1   Fluticasone Propionate (FLONASE NA), Place into the nose., Disp: , Rfl:    insulin glargine (LANTUS SOLOSTAR) 100 UNIT/ML Solostar Pen, Inject 6 Units into the skin daily., Disp: 15 mL, Rfl: 1   insulin lispro (HUMALOG KWIKPEN) 100 UNIT/ML KwikPen, Inject 8 Units into the skin 3 (three) times daily., Disp:  15 mL, Rfl: 1   Insulin Pen Needle 32G X 4 MM MISC, Use to inject insulin as directed., Disp: 100 each, Rfl: 6   OneTouch Delica Lancets 33G MISC, USE TO CHECK BLOOD GLUCOSE BEFORE BREAKFAST, Disp: , Rfl:    ONETOUCH ULTRA test strip, CHECK BLOOD GLUCOSE BEFORE BREAKFAST, Disp: 100 strip, Rfl: 1  No Known Allergies ROS neg/noncontributory except as noted HPI/below      Objective:     BP 123/83   Pulse 80   Temp 98.4 F (36.9 C) (Temporal)   Resp 16   Ht 5\' 7"  (1.702 m)   Wt 150 lb 4 oz (68.2 kg)   LMP 12/19/2023 (Approximate)   SpO2 97%   BMI  23.53 kg/m  Wt Readings from Last 3 Encounters:  01/02/24 150 lb 4 oz (68.2 kg)  11/30/23 155 lb 6 oz (70.5 kg)  04/18/23 162 lb 6.4 oz (73.7 kg)    Physical Exam   Gen: WDWN NAD HEENT: NCAT, conjunctiva not injected, sclera nonicteric NECK:  supple, no thyromegaly, no nodes, no carotid bruits CARDIAC: RRR, S1S2+, no murmur. DP 2+B LUNGS: CTAB. No wheezes ABDOMEN:  BS+, soft, NTND, No HSM, no masses EXT:  no edema MSK: no gross abnormalities. No TTP fibro points NEURO: A&O x3.  CN II-XII intact.  PSYCH: normal mood. Good eye contact  Discussed labs    Assessment & Plan:  Hyperglycemia due to type 1 diabetes mellitus (HCC) -     Ambulatory referral to Endocrinology  Pure hypercholesterolemia  Myalgia  Other orders -     Cyclobenzaprine HCl; Take 1 tablet (5 mg total) by mouth 3 (three) times daily as needed for muscle spasms.  Dispense: 30 tablet; Refill: 1  Assessment and Plan Assessment & Plan Type 1 Diabetes Mellitus   Blood glucose levels are elevated, often in the 200s, with recent nocturnal spikes. She is currently on 4 units of Lantus and 6 units of Humalog with each meal. A1c is elevated at 11, indicating poor glycemic control. A significant hypoglycemic event occurred in January. Variability in blood glucose may be related to the insulin regimen and dietary intake. Increase Lantus to 6-8 units daily due to persistent hyperglycemia. Consider increasing Humalog to 7 units with meals, especially with higher carbohydrate meals. Refer to endocrinologist Dr. Talmage Nap for further management. Monitor blood glucose levels closely, especially during fasting periods. Educate on carbohydrate counting and insulin adjustment based on meal content.  Body Aches and Sleep Disturbance   She reports recent onset of body aches and difficulty sleeping, possibly related to diabetes or another condition. Pain is described as static and needle-like, especially when exposed to cold. Previous  tests for rheumatoid arthritis and inflammatory markers were negative. Similar symptoms were present before the diabetes diagnosis and were managed with muscle relaxants. Prescribe Flexeril (cyclobenzaprine) nightly to aid sleep and muscle relaxation. Discuss potential side effects of drowsiness and advise on timing of medication to avoid next-day drowsiness. Advise on adequate hydration and electrolyte intake, including magnesium supplementation (250-400 mg per day). Consider referral to sports medicine for further evaluation of possible fibromyalgia or other musculoskeletal disorders.  General Health Maintenance   Advise on maintaining adequate hydration and electrolyte balance. Consider taking a multivitamin with iron due to low indices, although specific iron levels were not checked.  Follow-up   Close monitoring of diabetes management and body aches is required. Schedule a follow-up appointment in one month to reassess diabetes management and sleep issues. Contact Dr. Debbora Lacrosse office  to schedule an appointment with the endocrinologist.    Return in about 4 weeks (around 01/30/2024) for insomnia.  Angelena Sole, MD

## 2024-01-02 NOTE — Patient Instructions (Addendum)
 It was very nice to see you today!  Electrolytes daily in 1 water Magnesium 250mg /d  Add flexeril nightly  [facility] at [facility]  [address] on the 1st floor Phone number [phone]   Increase Lantus to 6 then 8units daily Call Dr Talmage Nap for appt   PLEASE NOTE:  If you had any lab tests please let us know if you have not heard back within a few days. You may see your results on MyChart before we have a chance to review them but we will give you a call once they are reviewed by [place]. If we ordered any referrals today, please let us know if you have not heard from their office within the next week.   Please try these tips to maintain a healthy lifestyle:  Eat most of your calories during the day when you are active. Eliminate processed foods including packaged sweets (pies, cakes, cookies), reduce intake of potatoes, white bread, white pasta, and white rice. Look for whole grain options, oat flour or almond flour.  Each meal should contain half fruits/vegetables, one quarter protein, and one quarter carbs (no bigger than a computer mouse).  Cut down on sweet beverages. This includes juice, soda, and sweet tea. Also watch fruit intake, though this is a healthier sweet option, it still contains natural sugar! Limit to 3 servings daily.  Drink at least 1 glass of water with each meal and aim for at least 8 glasses per day  Exercise at least 150 minutes every week.

## 2024-01-03 ENCOUNTER — Other Ambulatory Visit: Payer: Self-pay

## 2024-01-09 ENCOUNTER — Other Ambulatory Visit: Payer: Self-pay

## 2024-01-09 DIAGNOSIS — E1065 Type 1 diabetes mellitus with hyperglycemia: Secondary | ICD-10-CM | POA: Diagnosis not present

## 2024-01-10 ENCOUNTER — Other Ambulatory Visit: Payer: Self-pay

## 2024-01-10 DIAGNOSIS — E1065 Type 1 diabetes mellitus with hyperglycemia: Secondary | ICD-10-CM | POA: Diagnosis not present

## 2024-01-15 ENCOUNTER — Other Ambulatory Visit: Payer: Self-pay

## 2024-01-21 ENCOUNTER — Other Ambulatory Visit: Payer: Self-pay

## 2024-01-23 ENCOUNTER — Other Ambulatory Visit: Payer: Self-pay

## 2024-01-25 ENCOUNTER — Other Ambulatory Visit: Payer: Self-pay

## 2024-01-30 ENCOUNTER — Ambulatory Visit: Admitting: Family Medicine

## 2024-01-31 ENCOUNTER — Other Ambulatory Visit: Payer: Self-pay

## 2024-02-01 ENCOUNTER — Other Ambulatory Visit: Payer: Self-pay | Admitting: Family

## 2024-02-01 ENCOUNTER — Other Ambulatory Visit: Payer: Self-pay

## 2024-02-01 DIAGNOSIS — E1065 Type 1 diabetes mellitus with hyperglycemia: Secondary | ICD-10-CM

## 2024-02-05 ENCOUNTER — Other Ambulatory Visit: Payer: Self-pay

## 2024-02-06 ENCOUNTER — Ambulatory Visit: Admitting: Family Medicine

## 2024-02-07 ENCOUNTER — Other Ambulatory Visit: Payer: Self-pay

## 2024-02-12 ENCOUNTER — Other Ambulatory Visit: Payer: Self-pay

## 2024-02-15 ENCOUNTER — Other Ambulatory Visit: Payer: Self-pay

## 2024-02-28 ENCOUNTER — Other Ambulatory Visit: Payer: Self-pay

## 2024-03-12 ENCOUNTER — Other Ambulatory Visit: Payer: Self-pay

## 2024-03-24 ENCOUNTER — Other Ambulatory Visit: Payer: Self-pay

## 2024-04-07 ENCOUNTER — Other Ambulatory Visit: Payer: Self-pay

## 2024-05-01 ENCOUNTER — Other Ambulatory Visit: Payer: Self-pay

## 2024-07-21 ENCOUNTER — Other Ambulatory Visit (HOSPITAL_COMMUNITY): Payer: Self-pay
